# Patient Record
Sex: Male | Born: 1960 | Race: White | Hispanic: No | Marital: Single
Health system: Midwestern US, Community
[De-identification: ages and names within clinical notes are randomized; demographics above are authoritative.]

## PROBLEM LIST (undated history)

## (undated) DIAGNOSIS — I480 Paroxysmal atrial fibrillation: Secondary | ICD-10-CM

## (undated) DIAGNOSIS — I10 Essential (primary) hypertension: Secondary | ICD-10-CM

## (undated) DIAGNOSIS — R972 Elevated prostate specific antigen [PSA]: Secondary | ICD-10-CM

## (undated) DIAGNOSIS — Z1211 Encounter for screening for malignant neoplasm of colon: Secondary | ICD-10-CM

## (undated) HISTORY — PX: APPENDECTOMY: SHX54

---

## 1999-09-30 NOTE — ED Provider Notes (Signed)
[facility]                      EMERGENCY DEPARTMENT TREATMENT REPORT   NAME:  Roberto [name], Roberto [name]   MR #:  [id number]   BILLING #: [id number]        DOS: 09/30/1999  TIME: 5:11 P   cc:   Primary Physician:   Time of evaluation:  1700 hours.   CHIEF COMPLAINT:  Sore throat.   HISTORY OF PRESENT ILLNESS:  This 39 year old male seen on 30 September 1999   complains a sore throat since six o'clock this morning.  This is his only   complaint.  He rates the pain a 4 out of 10 except when he swallows.  Also   complains of a swollen uvula which has flared up since this morning.  He   denies any fever, chills, nausea, vomiting, or diarrhea.  No shortness of   breath, chest pain, or headache.  He denies any GU or GI problems.   PAST MEDICAL HISTORY:  Appendectomy in the past, hypertension.   SOCIAL HISTORY:  He is a smoker.   MEDICATIONS:  He denies any medication.   ALLERGIES:  No known allergies.   REVIEW OF SYSTEMS:   CONSTITUTIONAL:  Denies fever, chills, or weight loss.   CARDIOVASCULAR:  Denies chest pain, pressure, or palpitations.  The patient   denies shortness of breath, dizziness, diaphoresis, N/V, radiation, or   paresthesias.   RESPIRATORY:  Denies shortness of breath, hemoptysis, productive cough.   He denies any neck stiffness, denies any difficulty breathing.  There is no   stridor on listening to his throat.  No cva tenderness.  No numbness.   PHYSICAL EXAMINATION:   VITAL SIGNS:  Blood pressure mechanical was 163/99, it will be retaken;   pulse 77; respirations 16; temperature 97.3. Rates it 4 out of 10 on a pain   scale.   GENERAL:  Verbal, relaxed, cooperative.  Well-developed, well-nourished,   conscious, nontoxic, appears hydrated, alert, and oriented.  No respiratory   distress.  Appearance and behavior are age and situation appropriate.   LUNGS:  Clear to auscultation, no retraction/bulging of intercostal spaces.    No adventitious sounds noted.  Nontender to AP and lateral compression.   CARDIOVASCULAR:  Heart RRR, without obvious murmurs, gallops, rubs, or   thrills.  Carotid pulses 2+ and equal bilaterally without bruits.  DP   pulses 2+ and equal bilaterally.  No peripheral edema or significant   varicosities.   HEENT:  Unremarkable except for extremely erythematous posterior pharynx,   soft palate, and swollen, edematous uvula. There is also evidence of   extreme erythema to the anterior and the posterior walls of the pharynx.   There is no neck stiffness.  The neck was supple, nontender, no meningeal   signs.  At this time there was still no cervical or tonsillar adenopathy   noted.   No further testing was necessary. Repeat blood pressure was taken.  There   is no evidence of peritonsillar abscess.  There is no trismus.  There is no   facial swelling or cellulitis.  There is no mastoid tenderness.  Tympanic   membranes were unremarkable, intact, normal landmarks.  No sinus tenderness   on percussion.  His blood pressure retaken manually with a large cuff was   150/96.   FINAL DIAGNOSIS:   1.  Acute pharyngitis.   2.  Uvulitis.   3.  Hypertension  which needs evaluation.   DISPOSITION:  The patient was given verbal/written instructions in methods   of self-care, medication usage/compliance, and avoidance of aggravating   activities.  The patient should contact the referral physician and/or their   private MD.  The patient was advised not to return to full activities until   cleared by private MD or specialist, unless pain free and without any   symptoms.  The patient is discharged in stable condition with instructions   to return for any worsening or return of symptoms.    Information on acute   cellulitis and hypertension is provided.  The patient is to call [facility] for followup within 2-3 days and evaluation of elevated blood   pressure.  Return if fever, signs or symptoms worsen or persist.  The    patient was given Tylenol No. 3, Pen.Vee K, and Prednisone to be taken as   instructed.  Authorized not to work until April 6th.  This patient was   released in stable condition.   Electronically Signed By:   Wetzel Bjornstad Arvella Merles, M.D. 10/06/1999 03:11   ____________________________   Wetzel Bjornstad. Arvella Merles, M.D.   cd D:  09/30/1999 T:  10/04/1999 10:01 A   [id number]   Wolfgang Phoenix, PA-C

## 2000-08-25 NOTE — Procedures (Signed)
.*  Regular sinus rhythm   *RSR prime in right precordial leads   .*No prior tracing for comparison

## 2002-09-13 NOTE — ED Provider Notes (Signed)
[facility]                      EMERGENCY DEPARTMENT TREATMENT REPORT   NAME:  Roberto [name]                       PT. LOCATION:     ER  QC11   MR #:         BILLING #: [id number]          DOS: 09/13/2002   TIME:12:35 A   04-23-24   cc:   Primary Physician:   Time seen: 2340.   CHIEF COMPLAINT:   Bleeding from surgical site.   HISTORY OF PRESENT ILLNESS:   At 1500 today this 42 year old male underwent   removal of a left thoracic back sebaceous cyst at Dr. Theora Gianotti office.  He   was at home when later he felt some blood rushing down his back, so he came   to the emergency room for evaluation.  He denies any pain at this time.  No   fever or chills.   REVIEW OF SYSTEMS:   NEUROLOGICAL:   No sensory or motor symptoms.   PAST MEDICAL HISTORY:   Hypertension.   MEDICATIONS:   No current medications.   ALLERGIES:   No known drug allergies.   SOCIAL HISTORY:   Drinks several alcoholic beverages daily.   PHYSICAL EXAMINATION:   CONSTITUTIONAL:   Alert 42 year old male.   VITAL SIGNS:   On admission, blood pressure 167/111, pulse 62, respirations   18, temperature 96.5, O2 saturation is 100% on room air.   INTEGUMENTARY:  Left thoracic back: There is a 3-cm surgical incision over   the left thoracic back with 3 simple interrupted sutures intact.  There is   some oozing around the wound edges. No signs of infection.  No purulent   discharge.  The area is not tender to palpation.   IMPRESSION/MANAGEMENT PLAN:   The patient presents with what appeared to be   some fairly significant bleeding from his surgical site. I feel I will need   to insert 2 horizontal mattress sutures just to ensure that he does not   continue to bleed.  May also apply some Surgicel if there is still some   oozing.   PROCEDURES:  The wound was prepped in sterile Betadine fashion and   anesthetized with 2% lidocaine with epinephrine using 2 cc and washed with    normal saline.  Two horizontal mattress sutures were inserted around the   already existing sutures.  The bleeding did stop with the exception of one   small area which was trickling some blood. I then applied Surgicel and a   bulky dressing.  He tolerated well.   FINAL DIAGNOSIS:  Bleeding from surgical site, repaired.   DISPOSITION:   The patient was examined by Dr. Manson Passey who agrees with the   assessment and plan.  The patient was discharged to home with verbal and   written instructions for ongoing care. Instructed to keep the area clean   and dry in 2 to 3 days.  Soak off the Surgicel with a mixture of hydrogen   peroxide and water. Return for signs of infection.   Follow up with Dr.   Zachery Dauer.   Electronically Signed By:   Docia Furl, M.D. 09/15/2002 03:46   ____________________________   Docia Furl, M.D.   ec  D:  09/14/2002  T:  09/14/2002 12:39 P   [id number]   Dineen Kid, PA-C
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
## 2008-10-04 NOTE — ED Provider Notes (Signed)
[facility]                      EMERGENCY DEPARTMENT TREATMENT REPORT   NAME:  Roberto [name]               SEX:            M   DATE:  10/04/2008                     DOB:            09-Dec-1960   MR#    [id number]                       TIME SEEN        5:19 P   ACCT#  [id number]                      ROOM:           ER  ER10   cc:   Primary care physician   [facility] care.   CHIEF COMPLAINT   Back, rib pain.   HISTORY OF PRESENT ILLNESS   This is a 48 year old gentleman who fell approximately 1 week ago.  He   slipped on some steps and fell on his back injuring his right thoracic   region.  He was followed up at [facility] who at that time did not   feel that he needed x-rays and told him to go ahead and take Motrin that   was approximately 4 days ago .  The patient comes in today because he feels   as if he is having significant muscle spasms in his right thoracic region.   Feels like a needle-like pain is stabbing him.  He denies any pain on deep   inhalation.  He admits that his pain increases with movement.  He denies   any shortness of breath, abdominal pain, nausea or vomiting.  He denies any   other alleviating or aggravating factors associated with his condition.   REVIEW OF SYSTEMS   CONSTITUTIONAL:  No fever, chills, weight loss.   ENT: No sore throat, runny nose or other URI symptoms.   RESPIRATORY:  No cough, shortness of breath, or wheezing.   CARDIOVASCULAR:  No chest pain, chest pressure, or palpitations.   GASTROINTESTINAL:  No vomiting, diarrhea, or abdominal pain.   MUSCULOSKELETAL:  Right back thoracic pain.   INTEGUMENTARY:  No rashes.   PAST MEDICAL HISTORY   Hypertension.   CURRENT MEDICATIONS   Diovan, ibuprofen.   ALLERGIESNo known drug allergies.   SOCIAL HISTORY   Admits to tobacco use   PHYSICAL EXAMINATION   VITAL SIGNS:  Blood pressure is 179/109, pulse of 76, respirations 14,    temperature is 98.2, pain is 9/10, O2 saturation is 99% on room air.   GENERAL APPEARANCE:  The patient appears well developed and well nourished.   Appearance and behavior are age and situation appropriate.   HEENT:  Eyes:  Conjunctivae clear, lids normal.  Pupils equal, symmetrical,   and normally reactive.  Ears/Nose:  Hearing is grossly intact to voice.   Internal and external examinations of the ears and nose are unremarkable.   Mouth/Throat:  Surfaces of the pharynx, palate, and tongue are pink, moist,   and without lesions.   NECK:  Supple, nontender, symmetrical, no masses or JVD, trachea midline.   Thyroid not enlarged,  nodular, or tender.   LYMPHATIC:  No cervical or submandibular lymphadenopathy palpated.   RESPIRATORY:  Clear and equal breath sounds.  No respiratory distress,   tachypnea, or accessory muscle use.   CARDIOVASCULAR:  Heart regular, without murmurs, gallops, rubs, or thrills.   PMI not displaced.   CHEST:  Chest symmetrical without masses or tenderness.   GI:  Abdomen soft, nontender, without complaint of pain to palpation.  No   hepatomegaly or splenomegaly.   No abdominal or inguinal masses appreciated by inspection or palpation.   MUSCULOSKELETAL:  Stance and gait appear normal.   SPINE:  There is no localized cervical, thoracic, lumbar or sacral bony   tenderness to palpation or fist percussion.  There are no bony step-offs,   ecchymoses, areas of soft tissue swelling or deformities.   The patient does have pain to palpation over the right thoracic   paravertebral muscles.  He has excellent range of motion of his back.  ____   extend back, chest from side-to-side, however he does elicit pain.   SKIN:  Warm and dry without rashes.   INITIAL ASSESSMENT AND MANAGEMENT PLAN   This is a 48 year old gentleman that comes in with muscle spasms and pain   located in his right thoracic region.  Status post a fall 1 week ago.  We   are going to go ahead and get x-ray just to verify the absence or  presence   of pulmonary contusion, rib fracture.  Otherwise, we will go ahead and send   him home on some muscle relaxers and pain medication.   DIAGNOSTIC STUDIES   Chest 2 views normal study.   CLINICAL COURSE   During the patient's stay in the emergency room he did not develop any new   or worsening symptoms.  He remained stable.   CLINICAL IMPRESSION/DIAGNOSIS   Muscle spasms.   DISPOSITION AND PLAN   The patient was discharged home in stable condition with discharge   instructions on muscle spasm.  He is to follow up with primary care.   Return to the emergency room if condition worsens or new symptoms develop.   Prescription for Robaxin and Vicodin were given.   Electronically Signed By:   Stormy Card, M.[name]. 10/24/2008 22:22   ____________________________   Stormy Card, M.[name].   My signature above authenticates this document and my orders, the final   diagnosis(es), discharge prescription(s) and instructions in the [facility] record.   ML  [name]:  10/04/2008  T:  10/05/2008  1:00 P   [id number]

## 2011-10-20 NOTE — Procedures (Signed)
Test Reason : Chest pain   Blood Pressure : ***/*** mmHG   Vent. Rate : 115 BPM     Atrial Rate : 127 BPM      P-R Int : 152 ms          QRS Dur : 088 ms       QT Int : 338 ms       P-R-T Axes : 024 046 049 degrees      QTc Int : 467 ms   Probable atrial tachycardia   Otherwise normal ECG   When compared with ECG of 25-Aug-2000 17:23,   No significant change since prior tracing   Confirmed by Hyacinth Meeker, M.D., Jimmy ([id number]) on 10/20/2011 4:56:14 PM   Referred By:             Overread By: Henreitta Cea, M.D.

## 2011-10-20 NOTE — Procedures (Signed)
Study ID: [id number]                                                      [facility]                                                      [address] [place], [place] [address]                            Exercise Stress Echocardiogram Report           Name: NICKOLAI, RINKS Date: 10/21/2011 12:19 PM   MRN: [id number]                 Patient Location: ERO^EO10^EO10^C   DOB: 11/08/1960             Age: 51 yrs   Height: 70 in               Weight: 242 lb                         BSA: 2.3 m2   BP: 142/92 mmHg             HR: 127   Gender: Male                Account #: [id number]   Reason For Study: CHEST PAIN   History: smoker,cocaine,   Ordering Physician: Cipriano Mile, DAVID   Performed By: Nancie Neas., RDCS       Interpretation Summary   The Electrocardiographic Interpretation: negative by ECG criteria.   Exercise increased PAC's, VPC's and short bursts of MAT.   The Echocardiographic Interpretation: hyperkinetic wall motion.   The OverAll Impression : negative for ischemia with low likelihood for CAD.       Stress Results              Protocol:  Bruce              Target HR: 143 bpm         Maximum Predicted HR: 168 bpm                          Stress Duration:   3:14 mm:ss *                      Maximum Stress HR: 160 bpm *           Stress Comments   A treadmill exercise test according to Bruce protocol was performed. NSR with   PAC's and short bursts MAT,. The baseline ECG displays normal ST segments.   PAC,PVC, and increased bursts MAT with exercise. There no ST  segment changes   during stress. Test was terminated due to target heart rate was achieved.   Patient developed no symptoms. Patient had no chest pain. Patient was   hypertensive.           I      WMSI = 1.00     % Normal = 100                                                                   No LV  segment   al                                                                   wall motion                                                                   abnormalities.                                                           II      WMSI = 1.00     % Normal = 100                                                                   No LV segment   al                                                                   wall motion                                                                   abnormalities.  Segments  Size   X - Cannot   1 - Normal   2 -         3 - Akinetic 4 -          1-2     small   Interpret                 Hypokinetic              Dyskinetic   3-5     moder   ate   5 -                                                             6-14    large   Aneurysmal                                                      15-16   diffu   se               Left Ventricle   The left ventricular chamber size at rest is normal. The left ventricular   chamber size at peak stress is smaller.       _____________________________________________________________________________   __           Electronically signed byDr. Barbara Cower. Hyacinth Meeker, MD   10/21/2011 01:38 PM

## 2011-10-20 NOTE — Procedures (Signed)
Test Reason : Chest pain   Blood Pressure : ***/*** mmHG   Vent. Rate : 112 BPM     Atrial Rate : 150 BPM      P-R Int : 000 ms          QRS Dur : 088 ms       QT Int : 348 ms       P-R-T Axes : 018 011 036 degrees      QTc Int : 475 ms   Sinus tachycardia With premature atrial complexes   Otherwise normal ECG   When compared with ECG of 20-Oct-2011 09:34, No significant change was found   Confirmed by Cleon Gustin, M.D., Madelynn Done. ([id number]) on 10/21/2011 9:29:26 AM   Referred By:             Overread By: Merlene Pulling, M.D.

## 2011-10-20 NOTE — Procedures (Signed)
Study ID: [id number]                                                      [facility]                                                      [address]                                                       [place], [place] [address]                            Exercise Stress Echocardiogram Report           Name: [name], Roberto D      Study Date: 10/21/2011 12:19 PM   MRN: [id number]                 Patient Location: ERO^EO10^EO10^C   DOB: 06/29/1960             Age: 50 yrs   Height: 70 in               Weight: 242 lb                         BSA: 2.3 m2   BP: 142/92 mmHg             HR: 127   Gender: Male                Account #: [id number]   Reason For Study: CHEST PAIN   History: smoker,cocaine,   Ordering Physician: PITROLO, DAVID   Performed By: Chitwood, Kristen A., RDCS       Interpretation Summary   The Electrocardiographic Interpretation: negative by ECG criteria.   Exercise increased PAC's, VPC's and short bursts of MAT.   The Echocardiographic Interpretation: hyperkinetic wall motion.   The OverAll Impression : negative for ischemia with low likelihood for CAD.       Stress Results              Protocol:  Bruce              Target HR: 143 bpm         Maximum Predicted HR: 168 bpm                          Stress Duration:   3:14 mm:ss *                      Maximum Stress HR: 160 bpm *           Stress Comments   A treadmill exercise test according to Bruce protocol was performed. NSR with   PAC's and short bursts MAT,. The baseline ECG displays normal ST segments.   PAC,PVC, and increased bursts MAT with exercise. There no ST   segment changes   during stress. Test was terminated due to target heart rate was achieved.   Patient developed no symptoms. Patient had no chest pain. Patient was   hypertensive.           I      WMSI = 1.00     % Normal = 100                                                                   No LV  segment   al                                                                   wall motion                                                                   abnormalities.                                                           II      WMSI = 1.00     % Normal = 100                                                                   No LV segment   al                                                                   wall motion                                                                   abnormalities.                                                                                                                         Segments  Size   X - Cannot   1 - Normal   2 -         3 - Akinetic 4 -          1-2     small   Interpret                 Hypokinetic              Dyskinetic   3-5     moder   ate   5 -                                                             6-14    large   Aneurysmal                                                      15-16   diffu   se               Left Ventricle   The left ventricular chamber size at rest is normal. The left ventricular   chamber size at peak stress is smaller.       _____________________________________________________________________________   __           Electronically signed byDr. Edward C. Miller, MD   10/21/2011 01:38 PM

## 2011-10-20 NOTE — ED Provider Notes (Signed)
MEDICATION ADMINISTRATION SUMMARY              Drug Name: *Tylenol, Dose Ordered: 650 mg, Route: Oral, Status:         Ordered, Time: 12:36 10/20/2011,          Drug Name: *Zofran, Dose Ordered: 4 mg, Route: IV Push, Status:         Ordered, Time: 12:36 10/20/2011,          Drug Name: *Morphine Sulfate, Dose Ordered: 2-4 mg, Route: IV Push,         Status: Ordered, Time: 12:36 10/20/2011,          Drug Name: *Mylanta, Dose Ordered: 30 mL, Route: Oral, Status:         Ordered, Time: 12:36 10/20/2011,          Drug Name: *Metoprolol Tartrate, Dose Ordered: 5 mg mg, Route: IV         Push, Status: Held, Time: 03:51 10/21/2011,          Drug Name: *Catapres, Dose Ordered: 0.1 mg, Route: Oral, Status:         Given, Time: 02:40 10/21/2011,          Drug Name: *Diovan, Dose Ordered: 160 mg, Route: Oral, Status: Given,         Time: 23:37 10/20/2011,          Drug Name: *Catapres, Dose Ordered: 0.1 mg, Route: Oral, Status:         Given, Time: 22:19 10/20/2011,          Drug Name: *Ibuprofen, Dose Ordered: 600 mg, Route: Oral, Status:         Given, Time: 21:13 10/20/2011,          Drug Name: *Ambien, Dose Ordered: 5 mg, Route: Oral, Status: Given,         Time: 21:12 10/20/2011,          Drug Name: Aspirin, Dose Ordered: 325 mg, Route: Oral, Status: Given,         Time: 12:29 10/20/2011,          Drug Name: Ibuprofen, Dose Ordered: 800 mg, Route: Oral, Status:         Given, Time: 11:30 10/20/2011,          Drug Name: Captopril, Dose Ordered: 25 mg, Route: Oral, Status:         Given, Time: 11:15 10/20/2011,          Drug Name: Nitro-Bid, Dose Ordered: 1 in, Route: Topical, Status:         Given, Time: 11:15 10/20/2011, *Additional information available in         notes, Detailed record available in Medication Service section.       KNOWN ALLERGIES   NKDA       OBS PROGRESS NOTE   OBS PROGRESS NOTE: OBS Progress Notes. Patient with normal         enzymes X 3, For cardiac stress in AM. (19:39 [name])      OBS Progress Notes. No new issues. BP remains high. Pt is supposed to be         on Diovan. May also need PRN Clonidine. Enz neg x 3. Cont CPP. (22:09         [name])     OBS Progress Notes. Patient hypertensive. Given clonidine 0.1 PO - 2nd         dose. Patient without complaints. Tachycardia persists. CVAL to see  prior to stress test. Will page. [name]. (Thu Oct 21, 2011 02:42         [name])     OBS Progress Notes. Pt resting without complaint. BP finally down. CVAL         consult. Continue current plan. (Thu Oct 21, 2011 05:05 [name])     OBS Progress Notes. agree with Ms [facility] note. Morey Hummingbird Oct 21, 2011 07:05         [name])       TRIAGE [name] Oct 20, 2011 09:37 [name])   TRIAGE NOTES:  STRAIGHT BACK - NEEDS HT/WT/VITALS/ALLERGIES/MEDS,         PLEASE. (Wed Oct 20, 2011 09:37 [name])   PATIENT: NAME: Roberto [name], AGE: 51, GENDER: male, DOB: Fri         07/26/1960, TIME OF GREET: Wed Oct 20, 2011 09:34, LANGUAGE:         [place], [place]: [id number], MEDICAL RECORD NUMBER: [id number], ACCOUNT         NUMBER: [id number], PCP: [facility],. (Wed Oct 20, 2011 09:37         [name])   ADMISSION: URGENCY: 2, TRANSPORT: Wheelchair, DEPT: Emergency,         BED: 2ED 30. (Wed Oct 20, 2011 09:37 [name])   COMPLAINT:  Cp. (Wed Oct 20, 2011 09:37 [name])   PRESENTING COMPLAINT:  SOB, For 3, weeks. (09:43 [name])   PAIN: No complaint of pain. (09:43 [name])      LT foot pain (I think I broke my foot). (09:43 [name])   IMMUNIZATIONS:  Last tetanus shot received less than 10 years         ago. (09:43 [name])   TREATMENT PRIOR TO ARRIVAL: None. (09:43 [name])   TB SCREENING: TB screen negative for this patient. (09:43         [name])   ABUSE SCREENING: Patient denies physical abuse or threats. (09:43         [name])   FALL RISK: Patient has a low risk of falling. (09:43 [name])   SUICIDAL IDEATION: Suicidal ideation is not present. (09:43         [name])   ADVANCE DIRECTIVES: Patient does not have advance directives.         (09:43 [name])    PROVIDERS: TRIAGE NURSE: Theodis Sato, RN. (Wed Oct 20, 2011         09:37 [name])   COMMENT:  EKG done at triage. (Wed Oct 20, 2011 09:37 [name])   PREVIOUS VISIT ALLERGIES: Nkda. (Wed Oct 20, 2011 09:37         [name])       PRESENTING PROBLEM (09:37 [name])      Presenting problems: Chest Pain - Adult.       CURRENT MEDICATIONS   Ibuprofen:  600 mg Oral As needed every 8 hours. prn pain. (21:02         [name])   Robaxin-750:  1 tab Oral As needed every four hours. x 750 mg -         Oral - Q4PRN. (21:02 [name])   Vicodin:  1 tab(s) Oral As needed every four hours. x 500 Mg-5 Mg         - Oral - Q4PRN. (21:02 [name])   Diovan:  120 mg Oral 2 times a day. (21:40 [name])       MEDICATION SERVICE   Ambien:  Order: Ambien (Zolpidem Tartrate) - Dose: 5 mg         :  Oral         Notes: At night PRN insomnia         Ordered by: Sharma Covert, PA-C         Entered by: Sharma Covert, PA-C Wed Oct 20, 2011 12:36 ,          Acknowledged by: Dara Hoyer Wed Oct 20, 2011 12:46         Documented as given by: Reggie Pile, RN Wed Oct 20, 2011 21:12          Patient, Medication, Dose, Route and Time verified prior to         administration.          Site: Medication administered P.O., Correct patient, time, route,         dose and medication confirmed prior to administration, Patient         advised of actions and side-effects prior to administration,         Allergies confirmed and medications reviewed prior to administration,         Patient tolerated procedure well, Patient in position of comfort,         Side rails up, Cart in lowest position, Family at bedside, Call light         in reach, for not able go to sleep.   Aspirin:  Order: Aspirin - Dose: 325 mg : Oral         POTENTIAL SEVERE INTERACTION: Ibuprofen - Low risk interaction         Ordered by: Sharma Covert, PA-C         Entered by: Sharma Covert, PA-C Wed Oct 20, 2011 12:22          Documented as given by: Leane Platt, RN Wed Oct 20, 2011 12:29           Patient, Medication, Dose, Route and Time verified prior to         administration.          Site: Medication administered P.O., Correct patient, time, route,         dose and medication confirmed prior to administration, Patient         advised of actions and side-effects prior to administration,         Allergies confirmed and medications reviewed prior to administration.   Captopril:  Order: Captopril - Dose: 25 mg : Oral         Ordered by: Sharma Covert, PA-C         Entered by: Sharma Covert, PA-C Wed Oct 20, 2011 11:02 ,          Acknowledged by: Rodena Piety, RN Wed Oct 20, 2011 11:14         Documented as given by: Rodena Piety, RN Wed Oct 20, 2011 11:15          Patient, Medication, Dose, Route and Time verified prior to         administration.          Amount given: 25 mg, Site: Medication administered P.O., Correct         patient, time, route, dose and medication confirmed prior to         administration, Patient advised of actions and side-effects prior to         administration, Allergies confirmed and medications reviewed prior to         administration.   Catapres:  Order: Catapres (Clonidine Hydrochloride) -  Dose: 0.1 mg : Oral         Notes: For B/P systolic &gt;/= 180 or B/P diastolic &gt;/= 100 give 0.1mg  x         1 dose         Ordered by: Stephan Minister, MD         Entered by: Stephan Minister, MD Wed Oct 20, 2011 22:11 ,          Acknowledged by: Reggie Pile, RN Wed Oct 20, 2011 22:12         Documented as given by: Reggie Pile, RN Wed Oct 20, 2011 22:19          Patient, Medication, Dose, Route and Time verified prior to         administration.          Amount given: 0.1 mg, Site: Medication administered P.O., Correct         patient, time, route, dose and medication confirmed prior to         administration, Patient advised of actions and side-effects prior to         administration, Allergies confirmed and medications reviewed prior to          administration, Patient tolerated procedure well, Patient in position         of comfort, Side rails up, Cart in lowest position, Family at         bedside, Call light in reach.   Catapres:  Order: Catapres (Clonidine Hydrochloride) -         Dose: 0.1 mg : Oral         Notes: For B/P systolic &gt;/= 180 or B/P diastolic &gt;/= 100 give 0.1mg  x         1 dose         Ordered by: April Manson, PA-C         Entered by: April Manson, PA-C Thu Oct 21, 2011 02:19 ,          Acknowledged by: Reggie Pile, RN Thu Oct 21, 2011 02:20         Documented as given by: Reggie Pile, RN Thu Oct 21, 2011 02:40          Patient, Medication, Dose, Route and Time verified prior to         administration.          Amount given: 0.1 mg, Site: Medication administered P.O., Correct         patient, time, route, dose and medication confirmed prior to         administration, Patient advised of actions and side-effects prior to         administration, Allergies confirmed and medications reviewed prior to         administration, Patient tolerated procedure well, Patient in position         of comfort, Side rails up, Cart in lowest position, Family at         bedside, Call light in reach.   Diovan:  Order: Diovan (Valsartan) - Dose: 160 mg :         Oral         Notes: 2 times daily, Per order may take per Med Recon         Ordered by: Sharma Covert, PA-C         Entered by: Reggie Pile, RN Wed Oct 20, 2011 21:38 ,  Acknowledged by: Reggie Pile, RN Wed Oct 20, 2011 21:38         Documented as given by: Reggie Pile, RN Wed Oct 20, 2011 23:37          Patient, Medication, Dose, Route and Time verified prior to         administration.          Amount given: 160 mg, Site: Medication administered P.O., Correct         patient, time, route, dose and medication confirmed prior to         administration, Patient advised of actions and side-effects prior to          administration, Allergies confirmed and medications reviewed prior to         administration, Patient tolerated procedure well, Patient in position         of comfort, Side rails up, Cart in lowest position, Family at         bedside, Call light in reach.   Ibuprofen:  Order: Ibuprofen - Dose: 800 mg : Oral         Ordered by: Sharma Covert, PA-C         Entered by: Sharma Covert, PA-C Wed Oct 20, 2011 11:23 ,          Acknowledged by: Rodena Piety, RN Wed Oct 20, 2011 11:31         Documented as given by: Rodena Piety, RN Wed Oct 20, 2011 11:30          Patient, Medication, Dose, Route and Time verified prior to         administration.          Amount given: 800 mg, Site: Medication administered P.O., Correct         patient, time, route, dose and medication confirmed prior to         administration, Patient advised of actions and side-effects prior to         administration, Allergies confirmed and medications reviewed prior to         administration.   Ibuprofen:  Order: Ibuprofen - Dose: 600 mg : Oral         Notes: every 8hrs prn, Per order may take per Med Recon         Ordered by: Sharma Covert, PA-C         Entered by: Reggie Pile, RN Wed Oct 20, 2011 21:04          Documented as given by: Reggie Pile, RN Wed Oct 20, 2011 21:13          Patient, Medication, Dose, Route and Time verified prior to         administration.          Amount given: 600 mg, Site: Medication administered P.O., Correct         patient, time, route, dose and medication confirmed prior to         administration, Patient advised of actions and side-effects prior to         administration, Allergies confirmed and medications reviewed prior to         administration, Patient tolerated procedure well, Patient in position         of comfort, Side rails up, Cart in lowest position, Family at         bedside, Call light in reach.   Metoprolol Tartrate:  Order: Metoprolol Tartrate -  Dose: 5 mg mg : IV Push          Notes: q X 3         Ordered by: Carolin Guernsey, DO         Entered by: Carolin Guernsey, DO Thu Oct 21, 2011 03:32 ,          Acknowledged by: Reggie Pile, RN Thu Oct 21, 2011 03:44,          Held by: Reggie Pile, RN Thu Oct 21, 2011 03:51 Reason: Symptoms         controlled at present.   Morphine Sulfate:  Order: Morphine Sulfate - Dose: 2-4         mg : IV Push         Notes: Every 4 hours PRN pain         Ordered by: Sharma Covert, PA-C         Entered by: Sharma Covert, PA-C Wed Oct 20, 2011 12:36 ,          Acknowledged by: Dara Hoyer Wed Oct 20, 2011 12:46.   Mylanta:  Order: Mylanta (Aluminum Hydroxide/Magnesium         Hydroxide/Simethicone) - Dose: 30 mL : Oral         Notes: Every 4 hours PRN for heartburn         Ordered by: Sharma Covert, PA-C         Entered by: Sharma Covert, PA-C Wed Oct 20, 2011 12:36 ,          Acknowledged by: Dara Hoyer Wed Oct 20, 2011 12:46.   Nitro-Bid:  Order: Nitro-Bid (Nitroglycerin) - Dose: 1         in : Topical         Ordered by: Sharma Covert, PA-C         Entered by: Sharma Covert, PA-C Wed Oct 20, 2011 11:02 ,          Acknowledged by: Rodena Piety, RN Wed Oct 20, 2011 11:14         Documented as given by: Rodena Piety, RN Wed Oct 20, 2011 11:15          Patient, Medication, Dose, Route and Time verified prior to         administration.          Time given: 1115, Amount given: 1 inch, Correct patient, time,         route, dose and medication confirmed prior to administration, Patient         advised of actions and side-effects prior to administration,         Allergies confirmed and medications reviewed prior to administration.   Tylenol:  Order: Tylenol (Acetaminophen) - Dose: 650 mg         : Oral         Notes: Every 4 hours PRN for headache         Ordered by: Sharma Covert, PA-C         Entered by: Sharma Covert, PA-C Wed Oct 20, 2011 12:36 ,          Acknowledged by: Dara Hoyer Wed Oct 20, 2011 12:46.    Zofran:  Order: Zofran (Ondansetron Hydrochloride) -         Dose: 4 mg : IV Push         Notes: Every 4 hours PRN nausea or vomiting  Ordered by: Sharma Covert, PA-C         Entered by: Sharma Covert, PA-C Wed Oct 20, 2011 12:36 ,          Acknowledged by: Dara Hoyer Wed Oct 20, 2011 12:46.       ORDERS   12 LEAD EKG:  Ordered for: Cipriano Mile, MD, Onalee Hua         Status: Active. (09:37 [name])   BP Monitor:  Ordered for: Cipriano Mile, MD, Onalee Hua         Status: Done by Wilmer Floor, RN, Marcille Buffy Oct 20, 2011 10:11. (09:51         KMJ)   IV- Saline Lock:  Ordered for: Cipriano Mile, MD, Onalee Hua         Status: Done by Wilmer Floor RN, Marcille Buffy Oct 20, 2011 10:11. (09:51         KMJ)   BASIC METABOLIC PANEL:  Ordered for: Cipriano Mile, MD, David         Status: Done by System Wed Oct 20, 2011 10:38. (09:51 KMJ)   TROPONIN I:  Ordered for: Cipriano Mile, MD, David         Status: Done by System Wed Oct 20, 2011 10:48. (09:51 KMJ)   HAND HELD NEBULIZER:  Ordered for: Cipriano Mile, MD, Onalee Hua         Status: Active. (09:51 KMJ)   Urine dip (send for lab U/A if positive):  Ordered for: Cipriano Mile,         MD, Onalee Hua         Status: Done by Wilmer Floor, RN, Marcille Buffy Oct 20, 2011 10:11. (09:51         [facility])   Cardiac Monitor:  Ordered for: Cipriano Mile, MD, Onalee Hua         Status: Done by Wilmer Floor, RN, Marcille Buffy Oct 20, 2011 10:11. (09:51         KMJ)   CHEST 2 VIEWS:  Ordered for: Cipriano Mile, MD, Onalee Hua         Status: Active. (09:51 KMJ)   CBC, AUTOMATED DIFFERENTIAL:  Ordered for: Cipriano Mile, MD, David         Status: Done by System Wed Oct 20, 2011 10:50. (09:51 KMJ)   O2 sat Monitor:  Ordered for: Cipriano Mile, MD, Onalee Hua         Status: Done by Wilmer Floor RN, Marcille Buffy Oct 20, 2011 10:11. (09:51         KMJ)   T4 FREE:  Ordered for: Cipriano Mile, MD, David         Status: Done by System Wed Oct 20, 2011 10:48. (09:52 KMJ)   THYROID STIMULATING HORMONE:  Ordered for: Cipriano Mile, MD, David         Status: Done by System Wed Oct 20, 2011 10:48. (09:52 KMJ)    Dr. Cleon Gustin please:  Ordered for: Cipriano Mile, MD, Onalee Hua         Status: Done by Rutherford Guys Oct 20, 2011 10:45.         (10:41 [name])   Avelino Leeds Disposable:  Ordered for: Cipriano Mile, MD, Onalee Hua         Status: Active. (10:48 [name])   Elita Boone IV Cath:  Ordered for: Pitrolo, MD, Onalee Hua         Status: Active. (10:48 [name])   BP Cuff Adult Regular:  Ordered for: Pitrolo, MD, Onalee Hua         Status: Active. (10:48 [name])   MONITOR ELECTRODE:  Ordered for: Cipriano Mile, MD, Onalee Hua  Status: Active. (10:48 [name])   IV Start kit:  Ordered for: [employer], MD, Onalee Hua         Status: Active. (10:48 [name])   CONTINUOUS PULSE OX:  Ordered for: Cipriano Mile, MD, Onalee Hua         Status: Active. (10:48 [name])   CPK PROFILE:  Ordered for: Cipriano Mile, MD, Onalee Hua         Status: Done by System Wed Oct 20, 2011 12:15. (11:02 KMJ)   MYOGLOBIN (BLOOD):  Ordered for: Cipriano Mile, MD, Onalee Hua         Status: Done by System Wed Oct 20, 2011 12:15. (11:02 KMJ)   D-DIMER:  Ordered forCipriano Mile, MD, David         Status: Done by System Wed Oct 20, 2011 12:30. (11:42 [name])   ED OBSERVATION for CEP:  Ordered for: Cipriano Mile, MD, Onalee Hua         Status: Done by Rutherford Guys Oct 20, 2011 11:44.         (11:43 [name])   URINE DRUG SCREEN:  Ordered for: Cipriano Mile, MD, David         Status: Done by System Wed Oct 20, 2011 12:35. (11:57 KMJ)   URINAL:  Ordered for: Cipriano Mile, MD, David         Status: Active. (12:33 AC)   Cardiac Diet, NPO after 0200 for Stress Test in AM:  Ordered for:         Cipriano Mile, MD, Onalee Hua         Status: Done by Ed Blalock Oct 20, 2011 12:45. (12:35         KMJ)   Bed rest with BRP:  Ordered for: Cipriano Mile, MD, Onalee Hua         Status: Done by Ed Blalock Oct 20, 2011 12:45. (12:35         [facility])   Order Stress Test. Type ___exercise_____:  Lenna Gilford forCipriano Mile,         MD, Onalee Hua         Status: Done by Ed Blalock Oct 20, 2011 12:45. (12:35         KMJ)   Rapid Chest Pain Rule Out Protocol:  Ordered for: Cipriano Mile, MD,          Onalee Hua         Status: Done by Ed Blalock Oct 20, 2011 12:45. (12:35         KMJ)   Pt to receive CP packet and view video:  Ordered for: Cipriano Mile,         MD, Onalee Hua         Status: Done by Ed Blalock Oct 20, 2011 13:23. (12:35         [facility])   Draw repeat cardiac enzymes at: 3,6  hrs:  Ordered for: Cipriano Mile,         MD, Onalee Hua         Status: Done by Ed Blalock Oct 20, 2011 12:45. (12:35         [facility])   Draw repeat myoglobin level at:  3 hrs:  Ordered for: Cipriano Mile,         MD, Onalee Hua         Status: Done by Ed Blalock Oct 20, 2011 12:45. (12:35         KMJ)   enter temp:  Ordered for: Cipriano Mile, MD, Onalee Hua         Status: Done by Cyndie Chime,  Simeon Craft Oct 20, 2011 13:23. (12:47         [facility])   12 LEAD EKG:  Ordered for: Cipriano Mile, MD, Onalee Hua         Status: Active. (13:12 RLA0)   MYOGLOBIN (BLOOD):  Ordered for: Cipriano Mile, MD, Onalee Hua         Status: Done by System Wed Oct 20, 2011 14:05. (13:17 RLA0)   TROPONIN I:  Ordered for: Cipriano Mile, MD, David         Status: Done by System Wed Oct 20, 2011 14:05. (13:17 RLA0)   CPK PROFILE:  Ordered for: Cipriano Mile, MD, Onalee Hua         Status: Done by System Wed Oct 20, 2011 14:05. (13:17 RLA0)      Ordered for: Cipriano Mile, MD, David         Status: Done by System Wed Oct 20, 2011 17:57. (16:30 RLA0)   TROPONIN I:  Ordered for: Cipriano Mile, MD, David         Status: Done by System Wed Oct 20, 2011 17:57. (16:30 RLA0)   12 LEAD EKG:  Ordered for: Cipriano Mile, MD, Onalee Hua         Status: Active. (17:20 RLA0)   CTA Chest:  Ordered for: Himmel Salch, DO, Jennifer         Status: Active         Comment: Suspect pulmonary embolism. Morey Hummingbird Oct 21, 2011 03:31         [name])   Discharge patient to home:  Ordered for: Carmela Hurt, MD, Romeo Apple         Status: Done by Wynona Neat Oct 21, 2011 16:34. Morey Hummingbird Oct 21, 2011 16:13 WCW)       NURSING ASSESSMENT: RESPIRATORY /CHEST (09:40 [name])   NURSING DIAGNOSIS: Notes: SOB with nausea.    CONSTITUTIONAL: Complex assessment performed, History obtained         from patient, Patient arrives ambulatory, Gait steady, Patient         appears comfortable, Patient cooperative, Patient alert, Oriented to         person, place and time, Skin warm, Skin dry, Skin normal in color,         Mucous membranes pink, Mucous membranes moist, Patient is         well-groomed.   PAIN:  Denies chest pain.   RESPIRATORY/CHEST: Respiratory assessment findings include         respiratory effort easy, Respirations regular, Conversing normally,         no signs of distress, Breath sounds with rhonchi, Scattered         posterior., Breath sounds with wheezing, scattered, Inspiratory.,         Neck and chest exam findings include trachea midline, Chest expansion         equal, Chest movement symmetrical, no jugular vein distension.       OBSERVATION ASSESSMENT: CARDIOVASCULAR   CARDIOVASCULAR: Cardiovascular assessment findings include heart         rate, irregular, tachycardic, Heart rhythm, with Infrequent premature         ventricular contractions, Associated with dyspnea, with exertion,         Associated with palpitations described as. (19:10 [name])     Left radial pulse +4(strong and bounding), Right radial pulse +4(strong         and bounding), Left dorsalis pedis pulse +4(strong and bounding),  Right dorsalis pedis pulse +4(strong and bounding), Notes: Assumed         care of pt. Pt denies chest pain at this time, no SOB at this time,         Remains on tele, irregular heart rate, 80 to 150. NPO for stress         test. Pt did not receive nicotine patch due to elevated BP. (Thu Oct 21, 2011 08:00 MNS1)   RESPIRATORY: Respiratory assessment findings include respiratory         effort easy, Respirations regular, Conversing normally, Breath sounds         clear, to bilateral upper lobes, Breath sounds diminished, to         bilateral lower lobes, Neck and chest exam findings include trachea          midline, Chest expansion equal, Chest movement symmetrical. (19:10         [name])     Respiratory assessment findings include respiratory effort easy,         Respirations regular, Conversing normally, Breath sounds diminished,         to bilateral lower lobes. (Thu Oct 21, 2011 08:00 MNS1)   SAFETY: Side rails up, Cart/Stretcher in lowest position, Family         at bedside, Call light within reach, Hospital ID band on. (19:10         [name])     Side rails up, Cart/Stretcher in lowest position, Call light within         reach, Hospital ID band on. (Thu Oct 21, 2011 08:00 MNS1)       NURSING PROCEDURE: ADMISSION (12:22 AC)   ADMISSION: Report called to, mary rn obs, Provided opportunity to         answer questions, Admission orders received and completed, Report         called at 1220, Patient Admited at. 1235, Acuity level non-urgent,         Transported via wheelchair, Accompanied by emergency department         technician, Transported with disposable blood pressure cuff,         Transported with basic life support care, Transported with saline         lock, Summary of Care printed.       NURSING PROCEDURE: CARDIAC MONITOR (09:40 [name])   PATIENT IDENTIFIER: Patient's identity verified by patient         stating name, Patient's identity verified by patient stating birth         date, Patient's identity verified by hospital ID bracelet.   CARDIAC MONITOR: Cardiac monitoring indicated for compliant of an         irregular heart rate, Patient placed on cardiac monitor, Heart rate:         100-120, showing sinus tachycardia, without ectopy, with no ST         segment changes, Patient placed on non-invasive blood pressure         monitor, with disposable blood pressure cuff applied, Patient placed         on continuous pulse oximetry, Adult/pediatric oxisensor applied,         Notes: Reports nausea and SOB.   FOLLOW-UP: After procedure, alarms set and on, After procedure,          patient tolerating monitoring, Notes: NSR/ Sinus tachycardia.       NURSING PROCEDURE: DISCHARGE  NOTE (Thu Oct 21, 2011 16:34 MNS1)   DISCHARGE: Patient discharged to home, ambulating without         assistance, driving self, unaccompanied, Summary of Care printed/         provided, Prescriptions given and instructions on side effects given,         Medication reconciliation form given, Above person(s) verbalized         understanding of discharge instructions and follow-up care, Notes:         Saline lock dced.   BELONGINGS: Belongings and valuables with patient at time of         discharge include:, Belongings remain with patient.       NURSING PROCEDURE: EKG CHART   PATIENT IDENTIFIER: Patient's identity verified by patient         stating name, Patient's identity verified by patient stating birth         date, Patient's identity verified by hospital ID bracelet. (09:40         ARK1)   EKG: EKG indicated for complaint of chest pain, 12 lead EKG         performed on the left chest, done by [email or web address], first EKG. (09:49         ARK1)   FOLLOW-UP: After procedure, EKG for interpretation given to Dr.         Jimmey Ralph, EKG was given to Dr. at 7829, Notes: ekg done in traige         @0934 ,pt taken straight back to room 30.nancy r.n.notified. (09:40         ARK1)   NOTES: Patient tolerated procedure well. (09:40 ARK1)   SAFETY: Side rails up, Cart/Stretcher in lowest position, Call         light within reach, Hospital ID band on. (09:40 ARK1)       NURSING PROCEDURE: IV   PATIENT IDENITIFIER: Patient's identity verified by patient         stating name, Patient's identity verified by patient stating birth         date, Patient's identity verified by hospital ID bracelet. (10:10         [name])     Patient's identity verified by patient stating name, Patient's identity         verified by patient stating birth date, Patient's identity verified         by hospital ID bracelet. Morey Hummingbird Oct 21, 2011 04:15 [name])    IV SITE 1: IV therapy indicated for medication administration, IV         therapy indicated for Possible medication administration., IV         established, to the left hand, using a 20 gauge catheter, in one         attempt, IV site prepped with Chlorhexadine., Saline lock         established, Flushed with normal saline (mls): 10 ml, Labs drawn at         time of placement, labeled in the presence of the patient and sent to         lab, Tourniquet removed from patient after procedure., Labs labeled         in the presence of the patient and then sent to the Lab. (10:10         [name])   FOLLOW-UP SITE 1: After procedure, sterile transparent dressing         applied, After procedure, no drainage at IV site,  After procedure, no         swelling at IV site, After procedure, no redness at IV site. (10:10         [name])   IV SITE 2: IV therapy indicated for medication administration, IV         established, to the left antecubital, using a 20 gauge catheter, in         one attempt, Saline lock established, Tourniquet removed from patient         after procedure. Morey Hummingbird Oct 21, 2011 04:15 [name])   FOLLOW-UP SITE 2: After procedure, IV secured by, After         procedure, IV line connections checked and properly labeled, After         removal, sterile dressing applied to IV site. Morey Hummingbird Oct 21, 2011 04:15         [name])   SAFETY: Side rails up, Cart/Stretcher in lowest position, Family         at bedside, Call light within reach, Hospital ID band on. (Thu Oct 21, 2011 04:15 [name])       NURSING PROCEDURE: NURSE NOTES   NURSES NOTES: Notes: Heart rate remains 90-140 and BP elevated-         Please advise!. (10:50 [name])     Patient re-evaluated by physician, Patient re-positioned to semi-Fowler's         position, Notes: Heart rate remains 90-145- Re-evaluated by Khaniya Tenaglia-         Medicated for foot pain. (11:30 [name])       NURSING PROCEDURE: TEACHING (14:25 MNS1)    TEACHING: Notes: Pt viewed video for stress test. Pt received         written information fpr chest pain, health risk of smoking and         cessation of smoking.       NURSING PROCEDURE: TRANSPORT TO TESTS   PATIENT IDENTIFIER: Patient's identity verified by patient         stating name, Patient's identity verified by patient stating birth         date, Patient's identity verified by hospital ID bracelet. (09:50         [name])     Patient's identity verified by patient stating name, Patient's identity         verified by patient stating birth date, Patient's identity verified         by hospital ID bracelet. (10:02 JNO)     Patient's identity verified by patient stating name, Patient's identity         verified by patient stating birth date, Patient's identity verified         by hospital ID bracelet. Morey Hummingbird Oct 21, 2011 05:10 [name])     Patient's identity verified by patient stating name, Patient's identity         verified by patient stating birth date, Patient's identity verified         by hospital ID bracelet, Patient actively involved in identification         process. (Thu Oct 21, 2011 12:00 MNS1)   TRANSPORT TO TESTS: Transport indicated to facilitate diagnosis,         Patient transported to x-ray, via cart, Accompanied by x-ray         technician. (09:50 [name])     Patient transported to x-ray, via cart, Accompanied by x-ray technician,  Hand-off report received from [place]. (10:02 JNO)     Patient transported to CT scan, Accompanied by x-ray technician. Morey Hummingbird Oct 21, 2011 05:10 [name])     Patient transported to, Cardiology, ambulatory, Accompanied by nurse.         (Thu Oct 21, 2011 12:00 MNS1)   FOLLOW-UP: After procedure, patient returned to emergency         department. (10:02 [name])     After procedure, patient returned to emergency department, Notes:         Returned to OBS, on monitor. No chest pain at this time. Received         juice and crackers post test. Morey Hummingbird Oct 21, 2011 12:50 MNS1)    NOTES: Patient tolerated procedure well. Morey Hummingbird Oct 21, 2011 05:10         [name])   SAFETY: Side rails up, Cart/Stretcher in lowest position, Family         at bedside, Call light within reach, Hospital ID band on. (10:02         JNO)       NURSING PROCEDURE: URINE COLLECTION (12:00 [name])   PATIENT IDENTIFIER: Patient's identity verified by patient         stating name, Patient's identity verified by patient stating birth         date, Patient's identity verified by hospital ID bracelet.   URINE COLLECTION MALE: Urine collection indicated for UDS         obtained and sent.       OBSERVATION PROCEDURE: CARDIAC MONITOR (19:10 [name])   PATIENT IDENTIFIER: Patient's identity verified by patient         stating name, Patient's identity verified by patient stating birth         date, Patient's identity verified by hospital ID bracelet.   CARDIAC MONITOR: Cardiac monitoring indicated for complaint of         chest pain, Cardiac monitoring indicated for compliant of an         irregular heart rate, Cardiac monitoring indicated for complaint of         palpitations, Patient placed on cardiac monitor, showing sinus         tachycardia.       OBSERVATION PROCEDURE: EKG   PATIENT IDENTITY: Patient's identity verified by patient stating         name, Patient's identity verified by patient stating birth date,         Patient's identity verified by hospital ID bracelet, Patient actively         involved in identification process. (13:08 RLA0)     Patient's identity verified by patient stating name, Patient's identity         verified by patient stating birth date, Patient's identity verified         by hospital ID bracelet, Patient actively involved in identification         process. (17:17 RLA0)   EKG: EKG indicated for tachy, 12 lead EKG performed on the left         chest, done by rachael. (13:08 RLA0)     12 lead EKG performed on the left chest, done by Rachael. (17:17         RLA0)    FOLLOW-UP: After procedure, EKG for interpretation given to Dr.         Cipriano Mile, EKG was given to Dr. at 1310. (13:08 RLA0)  After procedure, EKG for interpretation given to Dr. Ashley Royalty, EKG was         given to Dr. at 1721. (17:17 RLA0)       OBSERVATION PROCEDURE: LAB DRAW   PATIENT IDENTIFIER: Patient's identity verified by patient         stating name, Patient's identity verified by patient stating birth         date, Patient's identity verified by hospital ID bracelet. (13:26         RLA0)     Patient's identity verified by patient stating name, Patient's identity         verified by patient stating birth date, Patient's identity verified         by hospital ID bracelet. (16:46 RLA0)   LAB DRAW: Subsequent lab draw performed, from vascular access         device, existing IV site, After labs drawn, device flushed with         saline, amount (mL) 10, Lab specimens labeled in the presence of the         patient and sent to lab. (13:26 RLA0)     Subsequent lab draw performed, from vascular access device, existing IV         site, After labs drawn, device flushed with saline, amount (mL) 10,         Lab specimens labeled in the presence of the patient and sent to lab.         (16:46 RLA0)       OBSERVATION PROCEDURE: NURSE NOTES   NURSE NOTES: Meal tray given to patient, Notes: Pt denies chest         pain at this time. states that he has pain in Lt foot. Remain on         tele, Sinus tach. EKG completed at 1315. (14:22 MNS1)     Notes: Pt remains on tele, in SR with HR at/ 77. Pt appears to be         sleeping, eyes closed. (15:42 MNS1)     Notes: Pt remains on tele. HR 39. Notified PA Salch. (17:05 MNS1)     Patient in no apparent distress, Notes: EKG completed, read by Dr.         Ashley Royalty. (17:20 MNS1)     Patient in no apparent distress, Meal tray given to patient, Notes: Pt         resting, denies chest pain. Remains on tele, Sinsus tach. (18:30         MNS1)      Patient in no apparent distress, Assistance offered to patient, Patient         re-evaluated by physician, Notes: Pitrolo, MD, Onalee Hua and had aware of         pt irregular hr with hr 100-140's. (19:39 [name])     Patient in no apparent distress, Patient resting quietly, Assistance         offered to patient, Notes: c/o left foot pain, motrin 600mg  given per         med recon order, tolerated well. No c/o chest discomfort or chest         palpitation at this time. SR with irregular hr and sinus tach noted         on cardiac monitor. Nursing plan of care and teaching discussed with         pt. Pt has no question at this time. (21:14 [name])  Patient in no apparent distress, Assistance offered to patient, Beverage         given to patient, Patient re-evaluated by physician, Notes: Jerolyn Center,         MD, [facility] and had aware pt elevated bp, new order received. (22:09         [name])     Patient in no apparent distress, Patient resting quietly, Assistance         offered to patient, Beverage given to patient, Notes: no c/o chest         discomfort at this time. Sinus tach with irregular hr, HR 100's on         monitor. (23:33 [name])     Patient in no apparent distress, Assistance offered to patient, Notes:         self amb to bathroom with steady gait, no c/o chest discomfort at         this time. Sinus tach with irregular hr noted on cardiac monitor, HR         110's. (Thu Oct 21, 2011 02:01 [name])     Patient re-evaluated by physician, Notes: DR. Wenda Overland and had aware         pt had elevated bp and HR 110's. New order receive: CVAL to see prior         to stress test. (Thu Oct 21, 2011 02:42 [name])     Notes: reviewed tele monitor, around the 1700th hour 10/20/11, there is no         bradicardic episode noted. Base line HR above 80-100's. (Thu Oct 21, 2011 03:16 [name])     Notes: BP 132/85, Held Metoprolol Tartrate per Dr Wenda Overland, ROVB.         (Thu Oct 21, 2011 03:50 [name])      Notes: Consult completed by [facility] PA. Order for stress test treadmill.         Morey Hummingbird Oct 21, 2011 09:19 MNS1)     Notes: Pt resting, no chest pain. Remains on tele with irregular heart         rate. Stress test pending. Morey Hummingbird Oct 21, 2011 10:09 MNS1)     Patient is awaiting results, Notes: Pt resting. No chest pain. Remains on         tele. Morey Hummingbird Oct 21, 2011 13:15 MNS1)     Patient in no apparent distress, Patient is awaiting disposition, Notes:         Pt resting, denies chest pain. Morey Hummingbird Oct 21, 2011 14:45 MNS1)       OBSERVATION PROCEDURE: TRANSPORT TO TEST (Thu Oct 21, 2011 13:01 RLA0)   PATIENT IDENTIFIER: Patient's identity verified by patient         stating name, Patient's identity verified by patient stating birth         date, Patient's identity verified by hospital ID bracelet, Patient         actively involved in identification process.   FOLLOW-UP: After procedure, patient returned to ED Observation         department, Notes: pt placed back on monitor, snack given, vs taken.   SAFETY: Cart/Stretcher in lowest position, Call light within         reach, Hospital ID band on.   VITAL SIGNS: BP: 139, / 105, BP: (Sitting), Pulse: 53, Resp: 18,         Temp: 97.5, (Oral), Pain: 0, O2 sat: 98,  on Room air.       DIAGNOSIS (11:43 [name])   FINAL: PRIMARY: chest pain with exertional dyspnea.       DISPOSITION   PATIENT:  Disposition Type: Observation, Disposition: Chest Pain         Observation, Condition: Evaluation in Progress. (11:43 [name])      Patient left the department. (Thu Oct 21, 2011 16:35 MNS1)       VITAL SIGNS   VITAL SIGNS: BP: 162/123 (Sitting), Pulse: 107, Resp: 20, Pain:         2, O2 sat: 100 on Room air, Time: 10/20/2011 09:45. (09:45 [name])     BP: 163/110 (Sitting), Pulse: 124, Resp: 20, Pain: 2, O2 sat: 100 on Room         air, Time: 10/20/2011 10:45. (10:45 [name])     BP: 161/107 (Sitting), Pulse: 99, Resp: 24, Pain: 3, O2 sat: 100 on Room         air, Time: 10/20/2011 11:30. (11:30 [name])      BP: 140/97 (Sitting), Pulse: 123, Resp: 27, Pain: 3, O2 sat: 100 on Room         air, Time: 10/20/2011 12:29. (12:29 AC)     BP: 136/96 (Lying), Pulse: 164, Resp: 18, Temp: 98.0 (Oral), Pain: 0, O2         sat: 97% on Room air, Time: 10/20/2011 12:49. (12:49 MNS1)     BP: 142/100 (Lying), Pulse: 39, Resp: 18, Temp: 98.2 (Oral), Pain: 0, O2         sat: 95 on Room air, Time: 10/20/2011 17:01. (17:01 RLA0)     BP: 142/100 (Lying), Pulse: 39, Resp: 28, Temp: 98.2 (Oral), Pain: 0, O2         sat: 91% on Room air, Time: 10/20/2011 17:03. (17:03 MNS1)     BP: 140/90 (Lying), Pulse: 110, Resp: 20, Temp: 98.1 (Oral), Pain: 59feet         pain, O2 sat: 97 on Room air, Time: 10/20/2011 19:51. (19:51         [name])     BP: 150/105 (Lying), Pulse: 106, Resp: 20, Temp: 98.5 (Oral), Pain: 26foot         pain, O2 sat: 96 on Room air, Time: 10/20/2011 22:00. (22:00         [name])     BP: 146/97 (Lying), Pulse: 97, Resp: 18, Temp: 97.9 (Oral), Pain: 0, O2         sat: 95 on Room air, Time: 10/20/2011 23:31. (23:31 [name])     BP: 140/100 (Sitting), Pulse: 110, Resp: 18, Temp: 98.7 (Oral), Pain: 0,         O2 sat: 96 on Room air, Time: 10/21/2011 02:16. (Thu Oct 21, 2011         02:16 [name])     BP: 132/85 (Lying), Pulse: 96, Resp: 18, Temp: 97.9 (Oral), Pain: 0, O2         sat: 97 on Room air, Time: 10/21/2011 03:45. (Thu Oct 21, 2011 03:45         [name])     BP: 143/87 (Lying), Pulse: 64, Resp: 18, Temp: 97.5 (Oral), Pain: 0, O2         sat: 97 on Room air, Time: 10/21/2011 06:16. (Thu Oct 21, 2011 06:16         MLW0)     BP: 140/107 (Lying), Pulse: 35, Resp: 18, Temp: 97.4 (Oral), Pain: 0, O2         sat:  100 on Room air, Time: 10/21/2011 11:14. Morey Hummingbird Oct 21, 2011 11:14         RLA0)     Pulse: 60, Time: 10/21/2011 11:16. (Thu Oct 21, 2011 11:16 RLA0)     BP: 139/105 (Sitting), Pulse: 53, Resp: 18, Temp: 97.5 (Oral), Pain: 0,         O2 sat: 98 on Room air, Time: 10/21/2011 13:01. Morey Hummingbird Oct 21, 2011         13:01 RLA0)        INSTRUCTION (Thu Oct 21, 2011 16:01 WCW)   DISCHARGE:  CHEST PAIN OF UNCLEAR ETIOLOGY, HYPERTENSION (HTN,         HYPERTENSION, HIGH BP, ELEVATED BP).   Elenor QuinonesRayetta Humphrey, MEDICINE, [address]         [place] [address], [phone].   SPECIAL:  Follow up with primary care physician within 1 week.         Take Labetalol as prescribed.         Return to the ER if condition worsens or new symptoms develop.       PRESCRIPTION (Thu Oct 21, 2011 15:59 WCW)   Labetalol Hydrochloride:  Tablet : 100 Mg : Oral : Quantity: ***         1 *** Unit: tab(s) Route: Oral Schedule: 2 times a day Dispense: ***         60 ***.   NOTES:  No refills          May use generic.       ADMIN   DIGITAL SIGNATURE:  Hyacinth Meeker, RN, Tammy. (09:37 [name])      Wilmer Floor, RN, Harriett Sine. (12:54 [name])      Dara Hoyer. Morey Hummingbird Oct 21, 2011 16:35 MNS1)   Key:     AC=Conley, RN, Marchelle Folks  [name]=Bermudez, RN, Amui  ARK1=King, ACT III,     Angela     [name]=Towns, PA-C, Eber Jones  [name]=Pitrolo, MD, Onalee Hua  [name]=Hsu, MD, [facility]     [name]=Himmel Salch, DO, Victorino Dike  JNO=Ogdin, RAD [facility], Dorene Sorrow  KMJ=Jones,     PA-C, Diannia Ruder     [name]=Mathews, MD, [facility]  MLW0=Warren, ACT III, Morrell  MNS1=Schwarga,     [facility]     [name]=Collins-Day, RN, Harriett Sine  RLA0=Allen, ACT III, Rachael  [name]=Miller,     RN, Tammy     WCW=Warren, PA-C, Chrissie Noa

## 2011-10-20 NOTE — Procedures (Signed)
Test Reason : Chest pain   Blood Pressure : ***/*** mmHG   Vent. Rate : 115 BPM     Atrial Rate : 127 BPM      P-R Int : 152 ms          QRS Dur : 088 ms       QT Int : 338 ms       P-R-T Axes : 024 046 049 degrees      QTc Int : 467 ms   Probable atrial tachycardia   Otherwise normal ECG   When compared with ECG of 25-Aug-2000 17:23,   No significant change since prior tracing   Confirmed by Miller, M.D., Jimmy ([id number]) on 10/20/2011 4:56:14 PM   Referred By:             Overread By: Jimmy Miller, M.D.

## 2011-10-20 NOTE — Procedures (Signed)
Test Reason : Chest pain   Blood Pressure : ***/*** mmHG   Vent. Rate : 112 BPM     Atrial Rate : 150 BPM      P-R Int : 000 ms          QRS Dur : 088 ms       QT Int : 348 ms       P-R-T Axes : 018 011 036 degrees      QTc Int : 475 ms   Sinus tachycardia With premature atrial complexes   Otherwise normal ECG   When compared with ECG of 20-Oct-2011 09:34, No significant change was found   Confirmed by Ashby, M.D., Charles Jr. ([id number]) on 10/21/2011 9:29:26 AM   Referred By:             Overread By: Charles J    Ashby, M.D.

## 2012-03-13 NOTE — Procedures (Signed)
Test Reason : Chest pain   Blood Pressure : ***/*** mmHG   Vent. Rate : 202 BPM     Atrial Rate : 141 BPM      P-R Int : 000 ms          QRS Dur : 082 ms       QT Int : 244 ms       P-R-T Axes : 000 000 082 degrees      QTc Int : 447 ms   Supraventricular tachycardia   Marked ST abnormality, possible inferior subendocardial injury   Abnormal ECG   When compared with ECG of 20-Oct-2011 17:16,   Supraventricular tachycardia has replaced Sinus rhythm   Vent. rate has increased BY  90 BPM   ST now depressed in Inferior leads   ST now depressed in Anterolateral leads   Confirmed by Hyacinth Meeker, M.D., Ramon Dredge [phone]) on 03/14/2012 8:38:55 AM   Referred By:             Overread By: Berton Blanding, M.D.

## 2012-03-13 NOTE — Procedures (Signed)
Test Reason : Chest pain   Blood Pressure : ***/*** mmHG   Vent. Rate : 097 BPM     Atrial Rate : 097 BPM      P-R Int : 142 ms          QRS Dur : 090 ms       QT Int : 362 ms       P-R-T Axes : 054 024 052 degrees      QTc Int : 459 ms   Normal sinus rhythm   Possible Left atrial enlargement   Borderline ECG   When compared with ECG of 20-Oct-2011 17:16,   No significant change was found   Confirmed by Hyacinth Meeker, M.D., Ramon Dredge ([id number]) on 03/14/2012 8:39:56 AM   Referred By:             Overread By: Berton [place], M.D.

## 2012-03-13 NOTE — Procedures (Signed)
Test Reason : Chest pain   Blood Pressure : ***/*** mmHG   Vent. Rate : 202 BPM     Atrial Rate : 141 BPM      P-R Int : 000 ms          QRS Dur : 082 ms       QT Int : 244 ms       P-R-T Axes : 000 000 082 degrees      QTc Int : 447 ms   Supraventricular tachycardia   Marked ST abnormality, possible inferior subendocardial injury   Abnormal ECG   When compared with ECG of 20-Oct-2011 17:16,   Supraventricular tachycardia has replaced Sinus rhythm   Vent. rate has increased BY  90 BPM   ST now depressed in Inferior leads   ST now depressed in Anterolateral leads   Confirmed by Miller, M.D., Edward ([id number]) on 03/14/2012 8:38:55 AM   Referred By:             Overread By: Edward Miller, M.D.

## 2012-03-13 NOTE — ED Provider Notes (Signed)
[facility]  EMERGENCY DEPARTMENT TREATMENT REPORT  NAME:  Roberto [name]  SEX:   M  ADMIT: 03/13/2012  DOB:   10-02-1960  MR#    [id number]  ROOM:    TIME SEEN: 12 04 PM  ACCT#  [id number]    cc: HAL BARNES MD    DATE OF SERVICE:   03/13/2012    CHIEF COMPLAINT:  Heart racing.    PRIMARY CARE PHYSICIAN:  Joannie Springs, MD     HISTORY OF PRESENT ILLNESS:  This is a 51 year old male who presents to the Emergency Department   complaining of his heart racing.  He states that this occurred just prior to   arrival.  He feels as though if he were to vomit he would feel better.    Describes sensation is at the bottom of his throat for these symptoms.  He has   no chest pain, no shortness of breath, no difficulty breathing.  He denies   any heavy alcohol use the night before.  He did have some alcohol use 2 nights   ago.  He did have a significant amount of caffeine use this morning.  He   states that he thinks he has had a similar episode in the past.  He is not   sure if he has ever been diagnosed with a condition called SVT.  Otherwise, he   has had no fevers, chills, nausea, vomiting, or diarrhea.    REVIEW OF SYSTEMS:  CONSTITUTIONAL:  No fever, chills, weight loss.   EYES: No visual symptoms.   ENT: No sore throat, runny nose, or other URI symptoms.   RESPIRATORY:  No cough, shortness of breath, or wheezing.   CARDIOVASCULAR:  Palpitations, no chest pain, no chest pressure.  GASTROINTESTINAL:  Some nausea, no vomiting or diarrhea.  GENITOURINARY:  No dysuria, frequency, or urgency.   MUSCULOSKELETAL:  No joint pain or swelling.   INTEGUMENTARY:  No rashes.   NEUROLOGICAL:  No headaches, sensory, or motor symptoms.     PAST MEDICAL HISTORY:  Hypertension, he has been off of his medications for the past 3 months   secondary to not being able to afford them with a recent insurance change.  He   has hyperlipidemia, arthritis.    PAST SURGICAL HISTORY:  Status post appendectomy.    SOCIAL HISTORY:   Currently he is a smoker.  He drinks alcohol multiple times a week.  He has a   history of using cocaine and marijuana, but none of these have been since last   Friday; which would be approximately 3 days ago.    ALLERGIES:  NO KNOWN DRUG ALLERGIES.    MEDICATIONS:  Suppose to be Diovan, he is unsure of the dose.  He cannot take it because he   cannot afford the prescriptions.    PHYSICAL EXAMINATION:  VITAL SIGNS:  Blood pressure 139/109, pulse is 200, respiration rate 14,   temperature 96% on room air.  GENERAL APPEARANCE:  Well-developed, well-nourished male who is resting on the   stretcher.  He is in no acute distress.  HEENT:  Eyes:  Conjunctivae clear, lids normal.  Pupils equal, symmetrical,   and normally reactive.  Mouth/Throat:  Surfaces of the pharynx, palate, and   tongue are pink, moist, and without lesions.   Nasal mucosa, septum, and   turbinates unremarkable.    RESPIRATORY:  Clear and equal breath sounds.  No respiratory distress,   tachypnea, or accessory muscle use.  CARDIOVASCULAR:  Regular, tachycardic, no audible murmurs, rubs, or gallops.    No peripheral edema or significant varicosities.   GASTROINTESTINAL:   Abdomen soft, nontender, without complaint of pain to   palpation.  No hepatomegaly or splenomegaly.   SKIN:  Warm and dry without rashes.   NEUROLOGIC:  Alert, oriented.  Sensation intact, motor strength equal and   symmetric.     INITIAL ASSESSMENT AND MANAGEMENT PLAN:   A 51 year old male.  EKG upon arrival shows that he is in a supraventricular   tachycardia with a rate in the 200s.  At this time he is quite comfortable in   no acute distress with his  SVT.  IV access was established.  He was placed on   cardiac pads, pulse oximetry, blood pressure and cardiac monitoring.  He was   bolused adenosine 6 mg times 1 with no effect.  He was then given 12 mg IV   bolus and had cardioversion to normal sinus rhythm.  His post-cardioversion    EKG showed normal sinus rhythm with no signs of ischemia.  There is no   significant depressions or elevations.  His symptoms completely resolved with   the feeling as though if he were to vomit his symptoms would improve.  He is   chest pain free and resting comfortably.  He was monitored and stayed in   normal sinus rhythm.  His repeat blood pressure continued to be elevated at   172/109 after cardioversion; therefore, he was given clonidine 0.1 mg orally.    I had a long discussion with the patient.  He states that his insurance   changed, he can no longer take Diovan.  He has not been back to Dr. Zachery Dauer to   discuss changing his medications.  He has significant reactions with Dyazide   and all the other diuretics and does not want to take those.  Therefore, I   have offered to write him for 2 weeks of clonidine 0.1 mg for blood pressure   management until he can get a hold of Dr. Zachery Dauer and discuss more appropriate   hypertension management.  I will also give him the number to cardiology   regarding the SVT should he have recurrence.  I have told him to avoid any   caffeine intake, stay well hydrated.  I suspect this SVT episode may have been   secondary to dehydration as well.  He is hemoconcentrated with hematocrit of   51.3 with an alcohol history, all of those I believe could potentially play   into this factor.  He states that he has not done cocaine but that also could   potentially cause this episode.  He states he has not done cocaine in the past   24 hours, but could also precipitate this episode.      CONTINUATION BY Jannetta Quint, MD    EMERGENCY DEPARTMENT COURSE:     The patient received clonidine 0.1 mg orally.  I repeated his blood pressure   approximately 1 hour after the clonidine, and his blood pressure was 133/99.    The patient will be discharged on clonidine 0.1 mg twice a day until he can   follow up with Dr. Zachery Dauer.  He states that he will follow up with Dr. Zachery Dauer.     We have given him 2 weeks of medications for the hypertension.  I have asked   him to avoid caffeine, cocaine and binge drinking in order to avoid him  having   recurrent episodes of SVT.  I have given him the number to Dr. Cleon Gustin for   [facility] (cardiology) for followup.    EMERGENCY DEPARTMENT DIAGNOSES:    1.  Supraventricular tachycardia.    2.  Uncontrolled hypertension.    PLAN:   As above.  I have asked the patient to return to the Emergency Department for   return of symptoms.  We did discuss vagal maneuvers to abort the SVT.  I have   asked him to return for chest pain, shortness of breath, or other acute   changes.     ADDENDUM:   I did review the patient's stress test in 01/2012 here in our hospital which   was completely negative.  The patient had no chest pain and no shortness of   breath.  Cardiac enzymes are not warranted for this condition.      ___________________  Lucio Edward MD  Dictated By: Marland Kitchen     My signature above authenticates this document and my orders, the final   diagnosis (es), discharge prescription (s), and instructions in the [facility]   Pulsecheck record.  DH2  D:03/13/2012  T: 03/13/2012 16:14:43  [id number]  Authenticated by Lucio Edward, MD On 03/22/2012 09:12:07 PM

## 2012-03-13 NOTE — Procedures (Signed)
Test Reason : Chest pain   Blood Pressure : ***/*** mmHG   Vent. Rate : 097 BPM     Atrial Rate : 097 BPM      P-R Int : 142 ms          QRS Dur : 090 ms       QT Int : 362 ms       P-R-T Axes : 054 024 052 degrees      QTc Int : 459 ms   Normal sinus rhythm   Possible Left atrial enlargement   Borderline ECG   When compared with ECG of 20-Oct-2011 17:16,   No significant change was found   Confirmed by Miller, M.D., Edward ([id number]) on 03/14/2012 8:39:56 AM   Referred By:             Overread By: Edward Miller, M.D.

## 2012-03-13 NOTE — ED Provider Notes (Signed)
MEDICATION ADMINISTRATION SUMMARY         Drug Name: CloNIDine Hydrochloride, Dose Ordered: 0.1 mg, Route:         Oral, Status: Given, Time: 11:41 03/13/2012,         Drug Name: *Sodium Chloride 0.9%, Intravenous, Dose Ordered: 1000 mL,         Route: IV Fluid, Status: Given, Time: 10:58 03/13/2012,         Drug Name: Adenosine, Dose Ordered: 12 mg, Route: IV Push, Status:         Given, Time: 10:52 03/13/2012,         Drug Name: Adenosine, Dose Ordered: 6 mg, Route: IV Push, Status:         Given, Time: 10:50 03/13/2012, *Additional information available in         notes, Detailed record available in Medication Service section.   KNOWN ALLERGIES   NKDA   TRIAGE [facility] Mar 13, 2012 10:47 [facility])   PATIENT: NAME: Roberto [name], [place]: male, DOB: Fri Mar 21, 1961, LANGUAGE: English, HEIGHT: 182cm. [facility] Mar 13, 2012 10:47 [facility])   ADMISSION: URGENCY: 1, TRANSPORT: Ambulatory, DEPT: Emergency,         BED: 2ED 22. [facility] Mar 13, 2012 10:47 [facility]   COMPLAINT:  SVT. (Mon Mar 13, 2012 10:47 [facility])   PRESENTING COMPLAINT:  SVT. (11:05 AC)   TB SCREENING: TB screen negative for this patient. (11:05 AC)   ABUSE SCREENING: Patient denies physical abuse or threats. (11:05         AC)   FALL RISK: Patient has a high risk of falling. (11:05 AC)   SUICIDAL IDEATION: Suicidal ideation is not present. (11:05 AC)   ADVANCE DIRECTIVES: Patient does not have advance directives.         (11:05 AC)   PROVIDERS: TRIAGE NURSE: Leane Platt, RN. (Mon Mar 13, 2012         10:47 [facility])   PREVIOUS VISIT ALLERGIES: NKDA. (11:05 AC)   PRESENTING PROBLEM Roberto [name] Mar 13, 2012 10:47 [facility])      Presenting problems: Tachycardia.   CURRENT MEDICATIONS (11:01 AC)   Diovan : Tablet : 40 Mg : Oral:  ??. pt states he has not taken         his bp meds.   MEDICATION SERVICE   Adenosine:  Order: Adenosine - Dose: 12 mg : IV Push         Ordered by: Jannetta Quint, M.D.         Entered by: Jannetta Quint, M.D. Mon Mar 13, 2012 10:55          Documented as given by: Leane Platt, RN Mon Mar 13, 2012 10:52         Patient, Medication, Dose, Route and Time verified prior to         administration.          IV SITE #1 into right antecubital, IV SITE #1 IVP, initial         medication, Slowly, Connections checked prior to administration, Line         traced prior to administration, Catheter placement confirmed via         flush prior to administration, IV site without signs or symptoms of         infiltration during medication administration, No swelling during         administration, No drainage during administration, IV flushed after  administration, Correct patient, time, route, dose and medication         confirmed prior to administration, Patient advised of actions and         side-effects prior to administration, Allergies confirmed and         medications reviewed prior to administration.   Adenosine:  Order: Adenosine - Dose: 6 mg : IV Push         Ordered by: Jannetta Quint, M.D.         Entered by: Jannetta Quint, M.D. Mon Mar 13, 2012 10:55         Documented as given by: Leane Platt, RN Mon Mar 13, 2012 10:50         Patient, Medication, Dose, Route and Time verified prior to         administration.          IV SITE #1 into right antecubital, IV SITE #1 IVP, initial         medication, Rapidly, Connections checked prior to administration,         Line traced prior to administration, Catheter placement confirmed via         flush prior to administration, IV site without signs or symptoms of         infiltration during medication administration, No swelling during         administration, No drainage during administration, IV flushed after         administration, Correct patient, time, route, dose and medication         confirmed prior to administration, Patient advised of actions and         side-effects prior to administration, Allergies confirmed and         medications reviewed prior to administration.    CloNIDine Hydrochloride:  Order: CloNIDine Hydrochloride         (Clonidine Hydrochloride) - Dose: 0.1 mg : Oral         Ordered by: Jannetta Quint, M.D.         Entered by: Jannetta Quint, M.D. Mon Mar 13, 2012 11:29 ,         Acknowledged by: Mosie Lukes) Benna Dunks, RN STAT Mon Mar 13, 2012         11:38         Documented as given by: Mosie Lukes) Benna Dunks, RN STAT Mon Mar 13, 2012 11:41         Patient, Medication, Dose, Route and Time verified prior to         administration.          Amount given: 0.1 MG, Site: Medication administered P.O., Correct         patient, time, route, dose and medication confirmed prior to         administration, Patient advised of actions and side-effects prior to         administration, Allergies confirmed and medications reviewed prior to         administration, Patient in position of comfort, Side rails up, Cart         in lowest position, Call light in reach.   Sodium Chloride 0.9%, Intravenous:  Order: Sodium Chloride 0.9%,         Intravenous (Sodium Chloride) - Dose: 1000 mL : IV Fluid         Notes: Bolus         *Reminder* Enter reason for fluid  below         For Hydration         Ordered by: Jannetta Quint, M.D.         Entered by: Jannetta Quint, M.D. Mon Mar 13, 2012 10:55         Documented as given by: Leane Platt, RN Mon Mar 13, 2012 10:58         Patient, Medication, Dose, Route and Time verified prior to         administration.          IV SITE #1 into right antecubital, IV SITE #1 IV fluids established,         IV SITE #1 1st bag hung, amount 1 Liter, IV SITE #1 bolus of 1000 ml         established, IV SITE #1 Rate of bolus, wide open, via primary tubing,         Connections checked prior to administration, Line traced prior to         administration, Catheter placement confirmed via flush prior to         administration, IV site without signs or symptoms of infiltration         during medication administration, No swelling during administration,          No drainage during administration, Correct patient, time, route, dose         and medication confirmed prior to administration, Patient advised of         actions and side-effects prior to administration, Allergies confirmed         and medications reviewed prior to administration.   : Follow Up : _IV SITE #1:_, IV fluid infusion discontinued, on         Mon Mar 13, 2012 12:00, Total fluid hydration time IV site 1 1 hour,         5 minutes, . (13:45 TRF0)   ORDERS   BASIC METABOLIC PANEL:  Ordered for: Buddy Duty, M.D., Toni Amend         Status: Canceled by: Katherine Roan, RN, Blanche [place] Mar 13, 2012 10:59.         (10:54 CTZ0)   CBC, WITH DIFFERENTIAL:  Ordered for: Buddy Duty, M.D., Courtney         Status: Done by: System - Mon Mar 13, 2012 11:39. (10:54 CTZ0)   BASIC METABOLIC PANEL:  Ordered forBuddy Duty, M.D., Courtney         Status: Done by: System - Mon Mar 13, 2012 11:31. (11:00 AC)   12 LEAD EKG:  Ordered for: Buddy Duty, M.D., Courtney         Status: Active. (11:05 JAG3)   12 LEAD EKG:  Ordered for: Buddy Duty, M.D., Toni Amend         Status: Active. (11:05 JAG3)   Rpeat BP 30 minutes after clonidine for disposition:  Ordered         for: Buddy Duty, M.D., Courtney         Status: Done by: Sullivan Lone, RN, [facility] Mar 13, 2012 12:14.         (12:08 CTZ0)   NURSING ASSESSMENT: CV WITH PROCEDURES (10:58 AC)   CONSTITUTIONAL: History obtained from patient, Patient arrives,         via hospital wheelchair, Gait steady, Patient appears comfortable,         Patient cooperative, Patient alert, Oriented to person, place and         time, Skin warm,  Skin dry, Skin normal in color, Mucous membranes         pink, Mucous membranes moist, Patient is well-groomed.   PAIN: pressure pain, to the left chest, on a scale 0-10 patient         rates pain as 3.   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   RESPIRATORY/CHEST: Respiratory assessment findings include         respiratory effort easy, Respirations regular, Conversing normally,         Breath sounds clear, Neck and chest exam findings include trachea         midline, Chest expansion equal, Chest movement symmetrical, no         associated cough noted, no associated fever.   IV: IV therapy indicated for hydration, IV therapy indicated for         medication administration, IV established, to the right antecubital,         using a 20 gauge catheter, in one attempt, Saline lock established,         Labs drawn at time of placement, Specimens labeled in the presence of         the patient, Tourniquet removed from patient after procedure., Notes:         by jen g.   CARDIAC MONITOR: Cardiac monitoring indicated for complaint of         chest pain, Cardiac monitoring indicated for SVT, Patient placed on         cardiac monitor, Heart rate: 190, showing paroxysmal supraventricular         tachycardia, Patient placed on non-invasive blood pressure monitor,         with disposable blood pressure cuff applied, Patient placed on         continuous pulse oximetry, Adult/pediatric oxisensor applied, on 2L,         via nasal cannula applied.   OXYGEN THERAPY: Oxygen therapy indicated for chest pain, Prior to         procedure, breath sounds clear, Oxygen saturation 98%, by         adult/pediatric oxisensor, 2L oxygen given, via nasal cannula         applied, via nasal cannula.   SAFETY: Side rails up, Cart/Stretcher in lowest position, Call          light within reach, Hospital ID band on.   NURSING PROCEDURE: DISCHARGE NOTE (13:45 TRF0)   DISCHARGE: Patient discharged to home, ambulating without         assistance, driving self, unaccompanied, Discharge instructions given         to patient, Simple or moderate discharge teaching performed, by Lamount Cranker, RN, Prescriptions given and instructions on side effects given,  Name of prescription(s) given: clonadine, Above person(s) verbalized         understanding of discharge instructions and follow-up care.   NURSING PROCEDURE: EKG CHART (11:07 JAG3)   PATIENT IDENTIFIER: Patient's identity verified by patient         stating name, Patient's identity verified by patient stating birth         date, Patient's identity verified by hospital ID bracelet.     Patient's identity verified by patient stating name, Patient's identity         verified by patient stating birth date, Patient's identity verified         by hospital ID bracelet.   EKG: EKG indicated for complaint of chest pain, 12 lead EKG         performed on the left chest, first EKG, Notes: done at 1043.     EKG indicated for complaint of chest pain, 12 lead EKG performed on the         left chest, second EKG.   FOLLOW-UP: After procedure, EKG for interpretation given to Dr.         Buddy Duty, EKG was given to Dr. at 1054.     After procedure, EKG for interpretation given to Dr. Buddy Duty, EKG was         given to Dr. at 1054.   NURSING PROCEDURE: URINE COLLECTION (12:14 JSB1)   PATIENT IDENTIFIER: Patient's identity verified by patient         stating name, Patient's identity verified by patient stating birth         date.   URINE COLLECTION MALE: Urine collected by void, output amount         (mL) 850cc, urine yellow in color, and clear.   DIAGNOSIS (13:32 CTZ0)   FINAL: PRIMARY: SVT, ADDITIONAL: Uncontrolled Hypertension.   DISPOSITION   PATIENT:  Disposition Type: Discharged, Disposition: Discharge,         Condition: Stable. (13:32 CTZ0)       Patient left the department. (13:46 TRF0)   VITAL SIGNS   VITAL SIGNS: BP: 139/109 (Lying), Pulse: 200, Resp: 14, Pain: 3,         O2 sat: 96 on Room air, Time: 03/13/2012 10:49. (10:51 AC)     BP: 184/121 (Lying), Pulse: 94, Resp: 16, Pain: 5, O2 sat: 97 on 2L O2         NC, Time: 03/13/2012 10:53. (10:54 AC)     Temp: 97.9 (Oral), Time: 03/13/2012 11:03. (11:03 AC)     BP: 172/109 (Lying), Pulse: 94, Resp: 22, O2 sat: 97% on 2L O2 NC, Time:         03/13/2012 11:42. (11:42 JSB1)     BP: 145/101 (Lying), Time: 03/13/2012 12:14. (12:14 JAG3)     BP: 146/103 (Lying), Time: 03/13/2012 13:06. (13:07 JAG3)     BP: 133/99 (Lying), Pulse: 71, Resp: 14, Pain: 0, O2 sat: 96% on Room         air, Time: 03/13/2012 13:37. (13:39 JSB1)   INSTRUCTION (13:34 CTZ0)   DISCHARGE:  SUPRAVENTRICULAR TACHYCARDIA (SVT, PSVT), ELEVATED         BLOOD PRESSURE (HTN, HYPERTENSION, HIGH BP, ELEVATED BP).   FOLLOWUPKaty Apo [place] [address], [phone].   SPECIAL:  Please follow-up with Dr. Zachery Dauer for evaluation,         management and treatment of your hypertension. Please call today for  an appointment. You need to follow-up with cardiology regarding your         episode of SVT. You need to avoid caffeine, binge drinking alcohol         and cocaine.   EVENTS   TRANSFER:  Triage to Emergency Main 22. (Mon Mar 13, 2012 10:47         [facility])      Removed from Emergency Main 22. (13:46 TRF0)   PRESCRIPTION (13:33 CTZ0)   CloNIDine Hydrochloride:  Tablet : 0.1 Mg : Oral : Quantity: ***         1 *** Unit: tab(s) Route: Oral Schedule: 2 times a day Dispense: ***         [id number] ***.   NOTES:  No refills          May use generic.   ADMIN (11:07 JAG3)   DIGITAL SIGNATURE:  Sullivan Lone, RN, Victorino Dike.   Key:     AC=Conley, RN, Marchelle Folks CTZ0=Zydron, M.D., Toni Amend JAG3=Gilbert, RN,     Victorino Dike     JSB1=Bailie, PM (JIM), Fayrene Fearing TRF0=Fitz, (TY), RN, Fabienne Bruns

## 2016-01-26 ENCOUNTER — Encounter (HOSPITAL_COMMUNITY): Payer: Self-pay | Admitting: Emergency Medicine

## 2016-01-26 ENCOUNTER — Emergency Department (HOSPITAL_COMMUNITY)
Admission: EM | Admit: 2016-01-26 | Discharge: 2016-01-26 | Disposition: A | Discharge status: Home / Self Care | Payer: BLUE CROSS/BLUE SHIELD | Attending: Emergency Medicine | Admitting: Emergency Medicine

## 2016-01-26 ENCOUNTER — Emergency Department (HOSPITAL_COMMUNITY): Payer: BLUE CROSS/BLUE SHIELD

## 2016-01-26 DIAGNOSIS — Z7982 Long term (current) use of aspirin: Secondary | ICD-10-CM | POA: Insufficient documentation

## 2016-01-26 DIAGNOSIS — M79675 Pain in left toe(s): Secondary | ICD-10-CM | POA: Insufficient documentation

## 2016-01-26 DIAGNOSIS — F17200 Nicotine dependence, unspecified, uncomplicated: Secondary | ICD-10-CM | POA: Insufficient documentation

## 2016-01-26 DIAGNOSIS — I10 Essential (primary) hypertension: Secondary | ICD-10-CM | POA: Diagnosis not present

## 2016-01-26 HISTORY — DX: Essential (primary) hypertension: I10

## 2016-01-26 LAB — BASIC METABOLIC PANEL
ANION GAP: 10 (ref 5–15)
BUN: 11 mg/dL (ref 6–20)
CALCIUM: 8.7 mg/dL — AB (ref 8.9–10.3)
CO2: 19 mmol/L — AB (ref 22–32)
CREATININE: 1.28 mg/dL — AB (ref 0.61–1.24)
Chloride: 103 mmol/L (ref 101–111)
GFR calc Af Amer: >60 mL/min (ref >60)
GFR calc non Af Amer: >60 mL/min (ref >60)
GLUCOSE: 106 mg/dL — AB (ref 65–99)
Potassium: 3.6 mmol/L (ref 3.5–5.1)
Sodium: 132 mmol/L — ABNORMAL LOW (ref 135–145)

## 2016-01-26 LAB — CBC WITH DIFFERENTIAL/PLATELET
BASOS PCT: 0 %
Basophils Absolute: 0 10*3/uL (ref 0.0–0.1)
Eosinophils Absolute: 0.1 10*3/uL (ref 0.0–0.7)
Eosinophils Relative: 1 %
HEMATOCRIT: 45.7 % (ref 39.0–52.0)
Hemoglobin: 14.8 g/dL (ref 13.0–17.0)
Lymphocytes Relative: 13 %
Lymphs Abs: 2 10*3/uL (ref 0.7–4.0)
MCH: 28.2 pg (ref 26.0–34.0)
MCHC: 32.4 g/dL (ref 30.0–36.0)
MCV: 87.2 fL (ref 78.0–100.0)
MONO ABS: 1.4 10*3/uL — AB (ref 0.1–1.0)
MONOS PCT: 9 %
NEUTROS ABS: 12 10*3/uL — AB (ref 1.7–7.7)
Neutrophils Relative %: 77 %
Platelets: 178 10*3/uL (ref 150–400)
RBC: 5.24 MIL/uL (ref 4.22–5.81)
RDW: 13.5 % (ref 11.5–15.5)
WBC: 15.4 10*3/uL — ABNORMAL HIGH (ref 4.0–10.5)

## 2016-01-26 MED ORDER — CARVEDILOL 12.5 MG PO TABS
12.5000 mg | ORAL_TABLET | Freq: Two times a day (BID) | ORAL | Status: DC
  Administered 2016-01-26: 12.5 mg via ORAL
  Filled 2016-01-26: qty 1

## 2016-01-26 MED ORDER — LISINOPRIL 20 MG PO TABS
40.0000 mg | ORAL_TABLET | Freq: Once | ORAL | Status: AC
  Administered 2016-01-26: 40 mg via ORAL
  Filled 2016-01-26: qty 2

## 2016-01-26 MED ORDER — HYDROCODONE-ACETAMINOPHEN 5-325 MG PO TABS
1.0000 | ORAL_TABLET | ORAL | Status: AC
  Administered 2016-01-26: 1 via ORAL
  Filled 2016-01-26: qty 1

## 2016-01-26 MED ORDER — INDOMETHACIN 25 MG PO CAPS: 25.0000 mg | ORAL_CAPSULE | Freq: Three times a day (TID) | ORAL | 0 refills | Status: AC | PRN

## 2016-01-26 NOTE — ED Provider Notes (Signed)
[facility]-EMER[facility]ENCY DEPT Provider Note   CSN: [id number] Arrival date & time: 01/26/16  0700  First Provider Contact:  First MD Initiated Contact with Patient 01/26/16 0732        History   Chief Complaint Chief Complaint  Patient presents with  . Foot Pain  . Hypertension    HPI Allen Porter is a 55 y.o. male.  Patient presents to the emergency room with complaints of left foot pain. Patient does not recall any injuries. He woke up this morning and noticed severe pain in his left big toe. Denies any fevers or chills. No numbness or weakness.  Patient also has a history of hypertension. He normally has blood pressures running in the 180s over 100s. He Does not think that he took his blood pressure medications on Sunday and is not sure if he took them on Saturday. He did not take his medication this morning Because the pain that we was Having in his toe threw him out of his usual routine.   The history is provided by the patient.  Foot Pain  This is a new problem. The problem occurs constantly. The problem has not changed since onset.Pertinent negatives include no chest pain, no abdominal pain, no headaches and no shortness of breath. The symptoms are aggravated by walking and standing (movement and palpation). Nothing relieves the symptoms. He has tried nothing for the symptoms. The treatment provided no relief.  Hypertension  Pertinent negatives include no chest pain, no abdominal pain, no headaches and no shortness of breath.    Past Medical History:  Diagnosis Date  . Hypertension     There are no active problems to display for this patient.   Past Surgical History:  Procedure Laterality Date  . APPENDECTOMY         Home Medications    Prior to Admission medications   Medication Sig Start Date End Date Taking? Authorizing Provider  aspirin 81 MG chewable tablet Chew 81 mg by mouth daily.   Yes Historical Provider, MD  carvedilol (COREG) 12.5 MG tablet Take 12.5 mg  by mouth 2 (two) times daily. 12/24/14  Yes Historical Provider, MD  lisinopril (PRINIVIL,ZESTRIL) 40 MG tablet Take 40 mg by mouth daily. 12/24/14  Yes Historical Provider, MD  therapeutic multivitamin-minerals (THERAGRAN-M) tablet Take 1 tablet by mouth daily.   Yes Historical Provider, MD  indomethacin (INDOCIN) 25 MG capsule Take 1 capsule (25 mg total) by mouth 3 (three) times daily as needed. 01/26/16   [name], MD  Meds pulled from Epic Outside records lisinopril (PRINIVIL) 40 mg PO TABS  Indications: Essential hypertension, benign Take 1 Tab by Mouth Once a Day. For hypertension 30 Tab  5 12/24/2014  Active  carvedilol (COREG) 12.5 mg PO TABS  Indications: Essential hypertension, benign, Paroxysmal SVT (supraventricular tachycardia) (HCC) Take 1 Tab by Mouth Twice Daily. For hypertension and SVT 60 Tab  5 12/24/2014  Active     Family History No family history on file.  Social History Social History  Substance Use Topics  . Smoking status: Current Every Day Smoker  . Smokeless tobacco: Not on file  . Alcohol use Yes     Allergies   Hctz [hydrochlorothiazide]   Review of Systems Review of Systems  Respiratory: Negative for shortness of breath.   Cardiovascular: Negative for chest pain.  Gastrointestinal: Negative for abdominal pain.  Musculoskeletal: Negative for back pain and joint swelling.  Skin: Negative for color change, pallor, rash and wound.  Neurological: Negative for  headaches.  All other systems reviewed and are negative.    Physical Exam Updated Vital Signs BP (!) 151/105   Pulse 63   Temp 97.5 F (36.4 C) (Oral)   Resp 19   Ht 6' (1.829 m)   Wt 95.3 kg   SpO2 97%   BMI 28.48 kg/m   Physical Exam  Constitutional: He appears well-developed and well-nourished. No distress.  HENT:  Head: Normocephalic and atraumatic.  Right Ear: External ear normal.  Left Ear: External ear normal.  Eyes: Conjunctivae are normal. Right eye exhibits no  discharge. Left eye exhibits no discharge. No scleral icterus.  Neck: Neck supple. No tracheal deviation present.  Cardiovascular: Normal rate, regular rhythm and intact distal pulses.   Pulmonary/Chest: Effort normal and breath sounds normal. No stridor. No respiratory distress. He has no wheezes. He has no rales.  Abdominal: Soft. Bowel sounds are normal. He exhibits no distension. There is no tenderness. There is no rebound and no guarding.  Musculoskeletal: He exhibits no edema.       Left ankle: Normal.       Left lower leg: Normal. He exhibits no tenderness and no swelling.       Left foot: There is tenderness and bony tenderness. There is normal range of motion, no swelling, normal capillary refill, no deformity and no laceration.  Neurological: He is alert. He has normal strength. No cranial nerve deficit (no facial droop, extraocular movements intact, no slurred speech) or sensory deficit. He exhibits normal muscle tone. He displays no seizure activity. Coordination normal.  Skin: Skin is warm and dry. No rash noted.  Psychiatric: He has a normal mood and affect.  Nursing note and vitals reviewed.    ED Treatments / Results  Labs (all labs ordered are listed, but only abnormal results are displayed) Labs Reviewed  BASIC METABOLIC PANEL - Abnormal; Notable for the following:       Result Value   Sodium 132 (*)    CO2 19 (*)    Glucose, Bld 106 (*)    Creatinine, Ser 1.28 (*)    Calcium 8.7 (*)    All other components within normal limits  CBC WITH DIFFERENTIAL/PLATELET - Abnormal; Notable for the following:    WBC 15.4 (*)    Neutro Abs 12.0 (*)    Monocytes Absolute 1.4 (*)    All other components within normal limits    EKG  EKG Interpretation  Date/Time:  Monday January 26 2016 07:57:02 EDT Ventricular Rate:  80 PR Interval:    QRS Duration: 94 QT Interval:  399 QTC Calculation: 461 R Axis:   50 Text Interpretation:  Sinus rhythm Short PR interval Borderline  repolarization abnormality No old tracing to compare Confirmed by Trequan Marsolek  MD-J, Ambrielle Kington ([id number]) on 01/26/2016 9:03:29 AM       Radiology Dg Foot Complete Left  Result Date: 01/26/2016 CLINICAL DATA:  Pain medially for 1 day.  No history of trauma EXAM: LEFT FOOT - COMPLETE 3+ VIEW COMPARISON:  None. FINDINGS: Frontal, oblique, and lateral views were obtained. There is no demonstrable fracture or dislocation. The joint spaces appear normal. No erosive change. There is a spur arising from the inferior calcaneus. IMPRESSION: Inferior calcaneal spur. No appreciable joint space narrowing or erosion. No acute fracture or dislocation. Electronically Signed   By: Bretta Bang III M.D.   On: 01/26/2016 08:15   Procedures Procedures (including critical care time)  Medications Ordered in ED Medications  carvedilol (COREG) tablet 12.5  mg (12.5 mg Oral Given 01/26/16 0753)  lisinopril (PRINIVIL,ZESTRIL) tablet 40 mg (40 mg Oral Given 01/26/16 0753)  HYDROcodone-acetaminophen (NORCO/VICODIN) 5-325 MG per tablet 1 tablet (1 tablet Oral Given 01/26/16 0753)     Initial Impression / Assessment and Plan / ED Course  I have reviewed the triage vital signs and the nursing notes.  Pertinent labs & imaging results that were available during my care of the patient were reviewed by me and considered in my medical decision making (see chart for details).  Clinical Course  Comment By Time  Asymptomatic hypertension.  Will check, labs, EKG.  Ordered a dose of his home medications.  Will xray foot [name], MD 07/31 [phone]  Patient has an elevated creatinine. This could be related to his hypertension or may be chronic. He also does have an elevated white blood cell count but exam does not suggest infection.   [name], MD 07/31 0900  Blood pressure is improving [name], MD 07/31 [phone]    Patient's blood pressure is improved with treatment. No evidence to suggest end organ ischemia or any acute complications  associated with his hypertension. I discussed taking his medications regularly. I'll also have him follow up with his primary doctor regarding his elevated creatinine.  Patient's toe pain could be related to a gout flare however he has never had anything like this before. Also possible the patient kicked something or stubbed his toe in the middle the night. He thinks was the most likely issue is he has done this before.  X-rays do not show any fracture. I doubt any acute infection. I  will treat with a short course of Indocin.  Final Clinical Impressions(s) / ED Diagnoses   Final diagnoses:  Toe pain, left  Essential hypertension    New Prescriptions New Prescriptions   INDOMETHACIN (INDOCIN) 25 MG CAPSULE    Take 1 capsule (25 mg total) by mouth 3 (three) times daily as needed.     [name], MD 01/26/16 [phone]

## 2016-01-26 NOTE — Discharge Instructions (Signed)
Follow-up with your primary care doctor regarding her blood pressure, remember to take her medications regularly. Take the medications for pain as needed. Monitor for fevers chills or worsening symptoms.

## 2016-01-26 NOTE — ED Triage Notes (Signed)
Pt. woke up this morning with left foot pain , denies injury , pain increases with movement , hypertensive at triage - pt. stated he has not taken his antihypertensive medication for 2 days .

## 2016-02-26 ENCOUNTER — Emergency Department (HOSPITAL_COMMUNITY): Payer: BLUE CROSS/BLUE SHIELD

## 2016-02-26 ENCOUNTER — Observation Stay (HOSPITAL_COMMUNITY)
Admission: EM | Admit: 2016-02-26 | Discharge: 2016-02-27 | Disposition: A | Discharge status: Home / Self Care | Payer: BLUE CROSS/BLUE SHIELD | Attending: Internal Medicine | Admitting: Internal Medicine

## 2016-02-26 ENCOUNTER — Encounter (HOSPITAL_COMMUNITY): Payer: Self-pay | Admitting: *Deleted

## 2016-02-26 DIAGNOSIS — I4891 Unspecified atrial fibrillation: Principal | ICD-10-CM | POA: Diagnosis present

## 2016-02-26 DIAGNOSIS — I471 Supraventricular tachycardia: Secondary | ICD-10-CM | POA: Insufficient documentation

## 2016-02-26 DIAGNOSIS — F1410 Cocaine abuse, uncomplicated: Secondary | ICD-10-CM | POA: Insufficient documentation

## 2016-02-26 DIAGNOSIS — R7989 Other specified abnormal findings of blood chemistry: Secondary | ICD-10-CM

## 2016-02-26 DIAGNOSIS — F1910 Other psychoactive substance abuse, uncomplicated: Secondary | ICD-10-CM | POA: Diagnosis present

## 2016-02-26 DIAGNOSIS — R778 Other specified abnormalities of plasma proteins: Secondary | ICD-10-CM

## 2016-02-26 DIAGNOSIS — F17200 Nicotine dependence, unspecified, uncomplicated: Secondary | ICD-10-CM | POA: Diagnosis not present

## 2016-02-26 DIAGNOSIS — Z7982 Long term (current) use of aspirin: Secondary | ICD-10-CM | POA: Insufficient documentation

## 2016-02-26 DIAGNOSIS — I10 Essential (primary) hypertension: Secondary | ICD-10-CM | POA: Diagnosis not present

## 2016-02-26 DIAGNOSIS — R079 Chest pain, unspecified: Secondary | ICD-10-CM

## 2016-02-26 DIAGNOSIS — Z23 Encounter for immunization: Secondary | ICD-10-CM | POA: Insufficient documentation

## 2016-02-26 HISTORY — DX: Paroxysmal atrial fibrillation: I48.0

## 2016-02-26 LAB — BASIC METABOLIC PANEL
Anion gap: 11 (ref 5–15)
BUN: 17 mg/dL (ref 6–20)
CO2: 20 mmol/L — ABNORMAL LOW (ref 22–32)
CREATININE: 1.23 mg/dL (ref 0.61–1.24)
Calcium: 9.7 mg/dL (ref 8.9–10.3)
Chloride: 105 mmol/L (ref 101–111)
GFR calc Af Amer: >60 mL/min (ref >60)
Glucose, Bld: 133 mg/dL — ABNORMAL HIGH (ref 65–99)
Potassium: 4 mmol/L (ref 3.5–5.1)
SODIUM: 136 mmol/L (ref 135–145)

## 2016-02-26 LAB — COMPREHENSIVE METABOLIC PANEL
ALBUMIN: 3.4 g/dL — AB (ref 3.5–5.0)
ALK PHOS: 69 U/L (ref 38–126)
ALT: 17 U/L (ref 17–63)
ANION GAP: 9 (ref 5–15)
AST: 18 U/L (ref 15–41)
BILIRUBIN TOTAL: 0.8 mg/dL (ref 0.3–1.2)
BUN: 14 mg/dL (ref 6–20)
CALCIUM: 8.9 mg/dL (ref 8.9–10.3)
CO2: 24 mmol/L (ref 22–32)
Chloride: 105 mmol/L (ref 101–111)
Creatinine, Ser: 1 mg/dL (ref 0.61–1.24)
GFR calc Af Amer: >60 mL/min (ref >60)
Glucose, Bld: 108 mg/dL — ABNORMAL HIGH (ref 65–99)
POTASSIUM: 3.8 mmol/L (ref 3.5–5.1)
Sodium: 138 mmol/L (ref 135–145)
TOTAL PROTEIN: 5.8 g/dL — AB (ref 6.5–8.1)

## 2016-02-26 LAB — CBC WITH DIFFERENTIAL/PLATELET
BASOS ABS: 0 10*3/uL (ref 0.0–0.1)
BASOS PCT: 0 %
EOS PCT: 0 %
Eosinophils Absolute: 0 10*3/uL (ref 0.0–0.7)
HCT: 43.2 % (ref 39.0–52.0)
Hemoglobin: 14.1 g/dL (ref 13.0–17.0)
LYMPHS PCT: 17 %
Lymphs Abs: 2.2 10*3/uL (ref 0.7–4.0)
MCH: 28.7 pg (ref 26.0–34.0)
MCHC: 32.6 g/dL (ref 30.0–36.0)
MCV: 87.8 fL (ref 78.0–100.0)
MONO ABS: 1.2 10*3/uL — AB (ref 0.1–1.0)
Monocytes Relative: 10 %
NEUTROS ABS: 9.1 10*3/uL — AB (ref 1.7–7.7)
Neutrophils Relative %: 73 %
PLATELETS: 180 10*3/uL (ref 150–400)
RBC: 4.92 MIL/uL (ref 4.22–5.81)
RDW: 13.9 % (ref 11.5–15.5)
WBC: 12.5 10*3/uL — AB (ref 4.0–10.5)

## 2016-02-26 LAB — RAPID URINE DRUG SCREEN, HOSP PERFORMED
Amphetamines: NOT DETECTED
Barbiturates: NOT DETECTED
Benzodiazepines: NOT DETECTED
Cocaine: POSITIVE — AB
OPIATES: NOT DETECTED
TETRAHYDROCANNABINOL: NOT DETECTED

## 2016-02-26 LAB — URINALYSIS, ROUTINE W REFLEX MICROSCOPIC
BILIRUBIN URINE: NEGATIVE
Glucose, UA: NEGATIVE mg/dL
HGB URINE DIPSTICK: NEGATIVE
KETONES UR: NEGATIVE mg/dL
Leukocytes, UA: NEGATIVE
Nitrite: NEGATIVE
PROTEIN: NEGATIVE mg/dL
SPECIFIC GRAVITY, URINE: 1.013 (ref 1.005–1.030)
pH: 5.5 (ref 5.0–8.0)

## 2016-02-26 LAB — MRSA PCR SCREENING: MRSA by PCR: NEGATIVE

## 2016-02-26 LAB — CBC
HCT: 51 % (ref 39.0–52.0)
Hemoglobin: 16.9 g/dL (ref 13.0–17.0)
MCH: 29 pg (ref 26.0–34.0)
MCHC: 33.1 g/dL (ref 30.0–36.0)
MCV: 87.5 fL (ref 78.0–100.0)
PLATELETS: 200 10*3/uL (ref 150–400)
RBC: 5.83 MIL/uL — ABNORMAL HIGH (ref 4.22–5.81)
RDW: 13.8 % (ref 11.5–15.5)
WBC: 17.4 10*3/uL — AB (ref 4.0–10.5)

## 2016-02-26 LAB — I-STAT TROPONIN, ED: Troponin i, poc: 0.03 ng/mL (ref 0.00–0.08)

## 2016-02-26 LAB — MAGNESIUM: Magnesium: 1.3 mg/dL — ABNORMAL LOW (ref 1.7–2.4)

## 2016-02-26 LAB — TROPONIN I
TROPONIN I: 0.16 ng/mL — AB (ref <0.03)
Troponin I: 0.23 ng/mL (ref <0.03)

## 2016-02-26 LAB — GLUCOSE, CAPILLARY: GLUCOSE-CAPILLARY: 105 mg/dL — AB (ref 65–99)

## 2016-02-26 LAB — HEPARIN LEVEL (UNFRACTIONATED): HEPARIN UNFRACTIONATED: 0.73 [IU]/mL — AB (ref 0.30–0.70)

## 2016-02-26 LAB — TSH: TSH: 0.468 u[IU]/mL (ref 0.350–4.500)

## 2016-02-26 MED ORDER — DILTIAZEM HCL ER 60 MG PO CP12
120.0000 mg | ORAL_CAPSULE | Freq: Two times a day (BID) | ORAL | Status: DC
  Administered 2016-02-26 (×2): 120 mg via ORAL
  Filled 2016-02-26 (×3): qty 2

## 2016-02-26 MED ORDER — ADULT MULTIVITAMIN W/MINERALS CH
1.0000 | ORAL_TABLET | Freq: Every day | ORAL | Status: DC
  Administered 2016-02-26 – 2016-02-27 (×2): 1 via ORAL
  Filled 2016-02-26 (×2): qty 1

## 2016-02-26 MED ORDER — FOLIC ACID 1 MG PO TABS
1.0000 mg | ORAL_TABLET | Freq: Every day | ORAL | Status: DC
  Administered 2016-02-26 – 2016-02-27 (×2): 1 mg via ORAL
  Filled 2016-02-26 (×2): qty 1

## 2016-02-26 MED ORDER — HEPARIN (PORCINE) IN NACL 100-0.45 UNIT/ML-% IJ SOLN
1400.0000 [IU]/h | INTRAMUSCULAR | Status: DC
  Administered 2016-02-26: 1500 [IU]/h via INTRAVENOUS
  Administered 2016-02-26: 1400 [IU]/h via INTRAVENOUS
  Filled 2016-02-26 (×3): qty 250

## 2016-02-26 MED ORDER — ACETAMINOPHEN 325 MG PO TABS
650.0000 mg | ORAL_TABLET | Freq: Four times a day (QID) | ORAL | Status: DC | PRN
  Administered 2016-02-26: 650 mg via ORAL
  Filled 2016-02-26: qty 2

## 2016-02-26 MED ORDER — ASPIRIN 81 MG PO CHEW
81.0000 mg | CHEWABLE_TABLET | Freq: Every day | ORAL | Status: DC
  Administered 2016-02-26 – 2016-02-27 (×2): 81 mg via ORAL
  Filled 2016-02-26 (×2): qty 1

## 2016-02-26 MED ORDER — THERAPEUTIC MULTIVIT/MINERAL PO TABS: 1.0000 | ORAL_TABLET | Freq: Every day | ORAL | Status: DC

## 2016-02-26 MED ORDER — MAGNESIUM SULFATE 2 GM/50ML IV SOLN
2.0000 g | Freq: Once | INTRAVENOUS | Status: AC
  Administered 2016-02-26: 2 g via INTRAVENOUS
  Filled 2016-02-26: qty 50

## 2016-02-26 MED ORDER — ONDANSETRON HCL 4 MG/2ML IJ SOLN: 4.0000 mg | Freq: Four times a day (QID) | INTRAMUSCULAR | Status: DC | PRN

## 2016-02-26 MED ORDER — LORAZEPAM 1 MG PO TABS: 1.0000 mg | ORAL_TABLET | Freq: Four times a day (QID) | ORAL | Status: DC | PRN

## 2016-02-26 MED ORDER — ONDANSETRON HCL 4 MG PO TABS: 4.0000 mg | ORAL_TABLET | Freq: Four times a day (QID) | ORAL | Status: DC | PRN

## 2016-02-26 MED ORDER — DILTIAZEM HCL 25 MG/5ML IV SOLN: 10.0000 mg | Freq: Once | INTRAVENOUS | Status: DC

## 2016-02-26 MED ORDER — ACETAMINOPHEN 650 MG RE SUPP: 650.0000 mg | Freq: Four times a day (QID) | RECTAL | Status: DC | PRN

## 2016-02-26 MED ORDER — ACETAMINOPHEN 500 MG PO TABS
1000.0000 mg | ORAL_TABLET | Freq: Once | ORAL | Status: AC
  Administered 2016-02-26: 1000 mg via ORAL
  Filled 2016-02-26: qty 2

## 2016-02-26 MED ORDER — OFF THE BEAT BOOK
Freq: Once | Status: AC
  Administered 2016-02-26: 07:00:00
  Filled 2016-02-26: qty 1

## 2016-02-26 MED ORDER — LORAZEPAM 2 MG/ML IJ SOLN
0.0000 mg | Freq: Two times a day (BID) | INTRAMUSCULAR | Status: DC
Start: 2016-02-28 — End: 2016-02-27

## 2016-02-26 MED ORDER — LORAZEPAM 2 MG/ML IJ SOLN: 1.0000 mg | Freq: Four times a day (QID) | INTRAMUSCULAR | Status: DC | PRN

## 2016-02-26 MED ORDER — DILTIAZEM LOAD VIA INFUSION
10.0000 mg | Freq: Once | INTRAVENOUS | Status: AC
  Administered 2016-02-26: 10 mg via INTRAVENOUS
  Filled 2016-02-26: qty 10

## 2016-02-26 MED ORDER — HEPARIN BOLUS VIA INFUSION
4000.0000 [IU] | Freq: Once | INTRAVENOUS | Status: AC
  Administered 2016-02-26: 4000 [IU] via INTRAVENOUS
  Filled 2016-02-26: qty 4000

## 2016-02-26 MED ORDER — ADENOSINE 6 MG/2ML IV SOLN
6.0000 mg | Freq: Once | INTRAVENOUS | Status: AC
  Administered 2016-02-26: 12 mg via INTRAVENOUS
  Administered 2016-02-26: 6 mg via INTRAVENOUS
  Administered 2016-02-26: 12 mg via INTRAVENOUS

## 2016-02-26 MED ORDER — THIAMINE HCL 100 MG/ML IJ SOLN: 100.0000 mg | Freq: Every day | INTRAMUSCULAR | Status: DC

## 2016-02-26 MED ORDER — ADENOSINE 6 MG/2ML IV SOLN
INTRAVENOUS | Status: AC
  Filled 2016-02-26: qty 4

## 2016-02-26 MED ORDER — LISINOPRIL 40 MG PO TABS
40.0000 mg | ORAL_TABLET | Freq: Every day | ORAL | Status: DC
  Administered 2016-02-26: 40 mg via ORAL
  Filled 2016-02-26: qty 1

## 2016-02-26 MED ORDER — VITAMIN B-1 100 MG PO TABS
100.0000 mg | ORAL_TABLET | Freq: Every day | ORAL | Status: DC
  Administered 2016-02-26 – 2016-02-27 (×2): 100 mg via ORAL
  Filled 2016-02-26 (×2): qty 1

## 2016-02-26 MED ORDER — PNEUMOCOCCAL VAC POLYVALENT 25 MCG/0.5ML IJ INJ
0.5000 mL | INJECTION | INTRAMUSCULAR | Status: AC
  Administered 2016-02-27: 0.5 mL via INTRAMUSCULAR
  Filled 2016-02-26: qty 0.5

## 2016-02-26 MED ORDER — ADENOSINE 6 MG/2ML IV SOLN
INTRAVENOUS | Status: AC
  Administered 2016-02-26: 12 mg via INTRAVENOUS
  Filled 2016-02-26: qty 2

## 2016-02-26 MED ORDER — ADENOSINE 6 MG/2ML IV SOLN
INTRAVENOUS | Status: AC
  Administered 2016-02-26: 12 mg via INTRAVENOUS
  Filled 2016-02-26: qty 4

## 2016-02-26 MED ORDER — DILTIAZEM HCL-DEXTROSE 100-5 MG/100ML-% IV SOLN (PREMIX)
10.0000 mg/h | INTRAVENOUS | Status: DC
  Administered 2016-02-26 (×2): 10 mg/h via INTRAVENOUS
  Filled 2016-02-26 (×2): qty 100

## 2016-02-26 MED ORDER — LORAZEPAM 2 MG/ML IJ SOLN
0.0000 mg | Freq: Four times a day (QID) | INTRAMUSCULAR | Status: DC
Start: 2016-02-26 — End: 2016-02-27

## 2016-02-26 NOTE — Consult Note (Signed)
Cardiology Consult    Patient ID: Allen Porter MRN: [id number], DOB/AGE: [birth year]   Admit date: 02/26/2016 Date of Consult: 02/26/2016  Primary Physician: No PCP Per Patient Reason for Consult: Atrial Fibrillation Primary Cardiologist: New - From [place] Requesting Provider: Dr. Sunnie Nielsen   History of Present Illness    Allen Porter is a 55 y.o. male with past medical history of polysubstance use (Cocaine, Alcohol, Marijuana, and tobacco), HTN, PAF, and pSVT who presented to [facility] ED on 02/26/2016 for palpitations.  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 Since then, he has indulged in alcohol and drug use. He reports drinking a 12-pack a day for the past several days and using Cocaine last night.  Around midnight, he awoke with palpitations and dyspnea, saying this felt similar to his previous episodes of pSVT. He had been instructed to attempt vagal maneuvers in the past and attempted this multiple times within a 30 minute period with no improvement of his symptoms.   Upon arriving to the ED, his HR was found to be elevated into the 180's. Initial EKG was interpreted as SVT and he was given 6mg  Adenosine without improvement. Given an additional 12mg  Adenosine and had return to NSR for less than 10 minutes then went back into SVT. He was given another 12mg  Adenosine with conversion to atrial fibrillation with RVR. He was started on a Cardizem drip along with Heparin and admitted for further evaluation.   He reports being on Coreg 12.5mg  BID, Lisinopril 40mg  daily, and 81mg  ASA prior to admission. Says he had not taken these for the past 3 days due to his drinking binge.  While admitted, labs have shown a WBC of 17.4, Hgb 16.9, and platelets 200. K+ 4.0. Creatinine 1.23. Cyclic troponin values are being obtained with initial value elevated at 0.16. TSH 0.468. Mg 1.3. UDS positive for cocaine. CXR  with no active disease. Most recent EKG shows atrial fibrillation with RVR, HR 126, with LVH and down-sloping ST in inferolateral leads likely consistent with repolarization abnormality  In reviewing Care Everywhere, he was hospitalized at [facility] in 06/2015 for atrial fibrillation with RVR. Started on Cardizem drip and converted to NSR during the admission. He was placed on ASA 81mg  daily due to his low CHA2DS2-VASc score. He had a nuclear stress test that admission which showed no evidence of ischemia or prior infarction. EF was estimated at 46%.  He denies any known repeat episodes of atrial fibrillation since. No known history of CAD. Does report smoking 1-2 packs of cigarettes per day and consuming a 12-pack of beer daily for the past several days. Says he uses Cocaine 3-4 times per year and Marijuana more regularly.    Past Medical History   Past Medical History:  Diagnosis Date  . Hypertension   . PAF (paroxysmal atrial fibrillation) (HCC)    a. initially occurring in 06/2015    Past Surgical History:  Procedure Laterality Date  . APPENDECTOMY       Allergies Allergies  Allergen Reactions  . Hctz [Hydrochlorothiazide] Shortness Of Breath and Palpitations    Inpatient Medications    . aspirin  81 mg Oral Daily  . diltiazem  120 mg Oral Q12H  . folic acid  1 mg Oral Daily  . LORazepam  0-4 mg Intravenous Q6H   Followed by  . [START ON 02/28/2016] LORazepam  0-4 mg Intravenous Q12H  .  multivitamin with minerals  1 tablet Oral Daily  . [START ON 02/27/2016] pneumococcal 23 valent vaccine  0.5 mL Intramuscular Tomorrow-1000  . thiamine  100 mg Oral Daily   Or  . thiamine  100 mg Intravenous Daily    Family History    Family History  Problem Relation Age of Onset  . Asthma Mother   . Diabetes Mellitus II Father     Social History    Social History   Social History  . Marital status: Single    Spouse name: N/A  . Number of children: N/A  . Years of  education: N/A   Occupational History  . Not on file.   Social History Main Topics  . Smoking status: Current Every Day Smoker    Packs/day: 1.00    Years: 30.00  . Smokeless tobacco: Never Used  . Alcohol use Yes     Comment: 12 bottles of beer daily.  . Drug use:     Types: Cocaine, Marijuana  . Sexual activity: Not on file   Other Topics Concern  . Not on file   Social History Narrative  . No narrative on file     Review of Systems    General:  No chills, fever, night sweats or weight changes.  Cardiovascular:  No chest pain, edema, orthopnea, paroxysmal nocturnal dyspnea. Positive for palpitations and dyspnea.  Dermatological: No rash, lesions/masses Respiratory: No cough, Positive for dyspnea Urologic: No hematuria, dysuria Abdominal:   No nausea, vomiting, diarrhea, bright red blood per rectum, melena, or hematemesis Neurologic:  No visual changes, wkns, changes in mental status. All other systems reviewed and are otherwise negative except as noted above.  Physical Exam    Blood pressure 116/75, pulse 85, temperature 98.4 F (36.9 C), temperature source Oral, resp. rate 16, height 6' (1.829 m), weight 205 lb 3.2 oz (93.1 kg), SpO2 98 %.  General: Pleasant, Caucasian male appearing in NAD Psych: Normal affect. Neuro: Alert and oriented X 3. Moves all extremities spontaneously. HEENT: Normal  Neck: Supple without bruits or JVD. Lungs:  Resp regular and unlabored, CTA without wheezing or rales. Heart: Irregularly irregular, no s3, s4, or murmurs. Abdomen: Soft, non-tender, non-distended, BS + x 4.  Extremities: No clubbing, cyanosis or edema. DP/PT/Radials 2+ and equal bilaterally.  Labs    Troponin [facility] of Care Test)  Recent Labs  02/26/16 0151  TROPIPOC 0.03    Recent Labs  02/26/16 0602 02/26/16 1224  TROPONINI 0.16* 0.23*   Lab Results  Component Value Date   WBC 12.5 (H) 02/26/2016   HGB 14.1 02/26/2016   HCT 43.2 02/26/2016   MCV 87.8  02/26/2016   PLT 180 02/26/2016     Recent Labs Lab 02/26/16 0602  NA 138  K 3.8  CL 105  CO2 24  BUN 14  CREATININE 1.00  CALCIUM 8.9  PROT 5.8*  BILITOT 0.8  ALKPHOS 69  ALT 17  AST 18  GLUCOSE 108*   No results found for: CHOL, HDL, LDLCALC, TRIG No results found for: [facility]   Radiology Studies    Dg Chest Port 1 View  Result Date: 02/26/2016 CLINICAL DATA:  Chest pain and dyspnea, onset tonight EXAM: PORTABLE CHEST 1 VIEW COMPARISON:  None. FINDINGS: A single AP portable view of the chest demonstrates no focal airspace consolidation or alveolar edema. The lungs are grossly clear. There is no large effusion or pneumothorax. Cardiac and mediastinal contours appear unremarkable. IMPRESSION: No active disease. Electronically Signed  By: Ellery Plunkaniel R Mitchell M.D.   On: 02/26/2016 04:00    EKG & Cardiac Imaging    EKG: Atrial fibrillation with RVR, HR 126, with LVH and down-sloping ST in inferolateral leads likely consistent with repolarization abnormality  Echocardiogram: 06/2015 FINDINGS:  Left Ventricle: Mild concentric left ventricular hypertrophy. Normal left ventricular cavity size.  Left Ventricle Normal global left ventricular systolic function. Mild diastolic dysfunction.  Function:  LVEF: 65 %   Left Atrium: Normal left atrial size. Left atrial volume index 6216mLm2.  Right Ventricle: Normal right ventricular size. Normal right ventricular global systolic function.  Right Atrium: Normal right atrial size.  Mitral Valve: Structurally/functionally normal mitral valve. No regurgitation.  Aortic Valve: Structurally normal aortic valve. No regurgitation.  Tricuspid Valve: Normally structured tricuspid valve. No regurgitation.  Pulmonic Valve: Structurally normal pulmonic valve.  Aorta: Normal aortic root diameter.  Pericardium: No pericardial effusion.  Masses / Shunts: No masses, shunts or thrombi seen.  Nuclear Stress Test: 06/2015 FINDINGS: Stress and rest  images reveal uniform radioisotope uptake with no consistent photopenia.  The gated SPECT images of Cardiolite study reveal normal left ventricular wall motion.  Low normal left ventricular systolic function with a gated calculated ejection fraction of 46%.  COMBINED INTERPRETATION: 1. No evidence for reversible myocardial ischemia or infarction. 2. Normal left ventricular wall motion, low normal systolic function with gated SPECT ejection fraction of 46%   Assessment & Plan    1. Atrial Fibrillation with RVR - had a history of this in 06/2015 and converted to NSR on IV Cardizem. Consumed an excessive amount of alcohol yesterday and used Cocaine, developing palpitations and presenting in SVT. Following conversion with Adenosine, he went into atrial fibrillation with RVR.  -  TSH 0.468. Mg 1.3 (being replaced - recheck in AM). UDS positive for cocaine. Most recent EKG shows atrial fibrillation with RVR, HR 126, LVH and down-sloping ST in inferolateral leads likely consistent with repolarization abnormality. - started on IV Cardizem and being switched to PO. HR currently in the 50's - 60's. Will stop IV Cardizem. Transition from Cardizem SR 120mg  BID to Cardizem CD tomorrow for improved compliance with once daily dosing. Avoid BB with recent Cocaine use.  - This patients CHA2DS2-VASc Score and unadjusted Ischemic Stroke Rate (% per year) is equal to 0.6 % stroke rate/year from a score of 1 (HTN). With this score, he does not require full-dose anticoagulation. Even if so, this would not be ideal in the setting of his alcohol abuse and noncompliance. Can continue 81mg  ASA.   2. Elevated Troponin - troponin values have been 0.16 and 0.23 thus far, likely secondary to demand ischemia in the setting of pSVT and atrial fibrillation with RVR - denies any recent anginal symptoms. 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  - recent NST in 06/2015 showed no  evidence of ischemia or prior infarction. EF was estimated at 46% but echo at that time showed preserved EF of 65%. With recent normal NST and lack of anginal symptoms, would not pursue further ischemic evaluation at this time.   3. HTN - BP well-controlled. - On Coreg 12.5mg  BID and Lisinopril 40mg  daily PTA. - would avoid resuming BB in setting of recent Cocaine use.   4. Polysubstance Abuse - reports consuming a 12 pack of beer daily, using Marijuana regularly, smoking 1-2 ppd, and using Cocaine 4 times per year. - patient says he is trying to cut back on use of  these substances and was strongly encouraged to do so.     Signed, Ellsworth Lennox, PA-C 02/26/2016, 2:00 PM Pager: [phone] Patient seen and examined and history reviewed. Agree with above findings and plan. 55 yo WM with history of SVT and paroxysmal AFib. Cardiac work up documented as above. Recent Etoh binge and cocaine abuse. Admitted with SVT initially then converted to afib. Rate currently controlled. Off IV diltiazem. [place] vasc score of 1 so risk of CVA is low. I would not recommend anticoagulation due to increased risk of bleeding with Etoh abuse. Patient has little insight into his illness and how his lifestyle is affecting his health. Recommend abstinence from Etoh and cocaine. Beta blockers contraindicated with cocaine use. Would DC on oral cardizem. No further cardiac work up needed this admission. Troponin elevation is due to demand ischemia from SVT and cocaine. Prior stress test normal.  Salam Chesterfield [place], MDFACC 02/26/2016 2:04 PM

## 2016-02-26 NOTE — ED Provider Notes (Signed)
[facility]-EMERGENCY DEPT Provider Note   CSN: [id number] Arrival date & time: 02/26/16  0130  By signing my name below, I, Allen Porter, attest that this documentation has been prepared under the direction and in the presence of Allen Crease, MD.  Electronically Signed: Octavia Porter, ED Scribe. 02/26/16. 1:47 AM.    History   Chief Complaint Chief Complaint  Patient presents with  . Tachycardia    The history is provided by the patient. No language interpreter was used.   HPI Comments: Allen Porter is a 55 y.o. male who has a Pmhx of SVT and HTN presents to the Emergency Department complaining of sudden onset, gradual worsening, moderate "fast heart beat" onset this evening secondary to using cocaine. He also notes associated shortness of breath and feeling mild left sided pain that occurred around the same time as his tachycardia. Pt has a hx of SVT and notes this feels similar. Pt says he smoked some cocaine these evening and notes he believed that sparked his current symptoms. Pt is currently on lisinopril, carvedilol, and 324 mg of ASA after using the cocaine. He denies any other complaints.  Past Medical History:  Diagnosis Date  . Hypertension     There are no active problems to display for this patient.   Past Surgical History:  Procedure Laterality Date  . APPENDECTOMY         Home Medications    Prior to Admission medications   Medication Sig Start Date End Date Taking? Authorizing Provider  aspirin 81 MG chewable tablet Chew 81 mg by mouth daily.    Historical Provider, MD  carvedilol (COREG) 12.5 MG tablet Take 12.5 mg by mouth 2 (two) times daily. 12/24/14   Historical Provider, MD  indomethacin (INDOCIN) 25 MG capsule Take 1 capsule (25 mg total) by mouth 3 (three) times daily as needed. 01/26/16   [name], MD  lisinopril (PRINIVIL,ZESTRIL) 40 MG tablet Take 40 mg by mouth daily. 12/24/14   Historical Provider, MD  therapeutic multivitamin-minerals  (THERAGRAN-M) tablet Take 1 tablet by mouth daily.    Historical Provider, MD    Family History No family history on file.  Social History Social History  Substance Use Topics  . Smoking status: Current Every Day Smoker  . Smokeless tobacco: Never Used  . Alcohol use Yes     Allergies   Hctz [hydrochlorothiazide]   Review of Systems Review of Systems  Respiratory: Positive for shortness of breath.   Musculoskeletal: Positive for arthralgias.  All other systems reviewed and are negative.    Physical Exam Updated Vital Signs BP 104/81   Pulse 113   Temp 97.9 F (36.6 C)   Resp 16   SpO2 97%   Physical Exam  Constitutional: He is oriented to person, place, and time. He appears well-developed and well-nourished. No distress.  HENT:  Head: Normocephalic and atraumatic.  Right Ear: Hearing normal.  Left Ear: Hearing normal.  Nose: Nose normal.  Mouth/Throat: Oropharynx is clear and moist and mucous membranes are normal.  Eyes: Conjunctivae and EOM are normal. Pupils are equal, round, and reactive to light.  Neck: Normal range of motion. Neck supple.  Cardiovascular: S1 normal and S2 normal.  Tachycardia present.  Exam reveals no gallop and no friction rub.   No murmur heard. Pulmonary/Chest: Effort normal and breath sounds normal. No respiratory distress. He exhibits no tenderness.  Abdominal: Soft. Normal appearance and bowel sounds are normal. There is no hepatosplenomegaly. There is no tenderness. There  is no rebound, no guarding, no tenderness at McBurney's point and negative Murphy's sign. No hernia.  Musculoskeletal: Normal range of motion.  Neurological: He is alert and oriented to person, place, and time. He has normal strength. No cranial nerve deficit or sensory deficit. Coordination normal. GCS eye subscore is 4. GCS verbal subscore is 5. GCS motor subscore is 6.  Skin: Skin is warm, dry and intact. No rash noted. No cyanosis.  Psychiatric: He has a normal  mood and affect. His speech is normal and behavior is normal. Thought content normal.  Nursing note and vitals reviewed.    ED Treatments / Results  DIAGNOSTIC STUDIES: Oxygen Saturation is 98% on RA, normal by my interpretation.  COORDINATION OF CARE:  1:44 AM Discussed treatment plan with pt at bedside and pt agreed to plan.  Labs (all labs ordered are listed, but only abnormal results are displayed) Labs Reviewed  BASIC METABOLIC PANEL - Abnormal; Notable for the following:       Result Value   CO2 20 (*)    Glucose, Bld 133 (*)    All other components within normal limits  CBC - Abnormal; Notable for the following:    WBC 17.4 (*)    RBC 5.83 (*)    All other components within normal limits  I-STAT TROPOININ, ED    EKG  EKG Interpretation (presentation)  Date/Time:  Thursday February 26 2016 01:33:27 EDT Ventricular Rate:  188 PR Interval:    QRS Duration: 82 QT Interval:  270 QTC Calculation: 477 R Axis:   6 Text Interpretation:  Supraventricular tachycardia Marked ST abnormality, possible inferior subendocardial injury Abnormal ECG Confirmed by POLLINA  MD, CHRISTOPHER [phone]) on 02/26/2016 2:39:25 AM       EKG Interpretation (after adenosine)  Date/Time:  Thursday February 26 2016 01:50:03 EDT Ventricular Rate:  109 PR Interval:    QRS Duration: 87 QT Interval:  338 QTC Calculation: 456 R Axis:   56 Text Interpretation:  Sinus tachycardia Atrial premature complexes Probable left atrial enlargement Borderline ST depression, diffuse leads Confirmed by Allen Leatherwood  MD, CHRISTOPHER [phone]) on 02/26/2016 2:42:20 AM       EKG Interpretation (recurrence of SVT)  Date/Time:  Thursday February 26 2016 02:13:24 EDT Ventricular Rate:  173 PR Interval:    QRS Duration: 82 QT Interval:  304 QTC Calculation: 516 R Axis:   2 Text Interpretation:  Supraventricular tachycardia ST depression prob rate related Confirmed by POLLINA  MD, CHRISTOPHER [phone]) on 02/26/2016 2:43:35  AM       EKG Interpretation (after adenosine)  Date/Time:  Thursday February 26 2016 02:13:24 EDT Ventricular Rate:  126 PR Interval:    QRS Duration: 89 QT Interval:  363 QTC Calculation: 526 R Axis:   42 Text Interpretation:  Atrial fibrillation Repol abnrm suggests ischemia, diffuse leads Prolonged QT interval Confirmed by Allen Leatherwood  MD, CHRISTOPHER ([id number]) on 02/26/2016 2:43:35 AM       Radiology No results found.  Procedures .Cardioversion Date/Time: 02/26/2016 1:45 AM Performed by: Allen Porter Authorized by: Allen Porter   Consent:    Consent obtained:  Verbal   Consent given by:  Patient Pre-procedure details:    Cardioversion basis:  Emergent   Rhythm:  Supraventricular tachycardia Attempt one:    Cardioversion mode attempt one: adenosine 54m IVP.   Shock outcome:  No change in rhythm Attempt two:    Cardioversion mode attempt two: adenosine 12mg  IVP.   Shock outcome:  Conversion to normal sinus  rhythm Post-procedure details:    Patient tolerance of procedure:  Tolerated well, no immediate complications  .Cardioversion Date/Time: 02/26/2016 1:52 AM Performed by: Allen CreasePOLLINA, CHRISTOPHER J Authorized by: Allen CreasePOLLINA, CHRISTOPHER J   Consent:    Consent obtained:  Verbal   Consent given by:  Patient Pre-procedure details:    Cardioversion basis:  Emergent   Rhythm:  Supraventricular tachycardia Attempt one:    Cardioversion mode attempt one: adenosine 12mg  IVP.   Shock outcome:  Conversion to other rhythm (AFIB w/ RVR) Post-procedure details:    Patient tolerance of procedure:  Tolerated well, no immediate complications   (including critical care time)  Medications Ordered in ED Medications  adenosine (ADENOCARD) 6 MG/2ML injection (  Not Given 02/26/16 0152)  diltiazem (CARDIZEM) 1 mg/mL load via infusion 10 mg (10 mg Intravenous Bolus from Bag 02/26/16 0225)    And  diltiazem (CARDIZEM) 100 mg in dextrose 5% 100mL (1 mg/mL) infusion (15  mg/hr Intravenous Rate/Dose Change 02/26/16 0307)  adenosine (ADENOCARD) 6 MG/2ML injection 6 mg (12 mg Intravenous Given 02/26/16 0222)  acetaminophen (TYLENOL) tablet 1,000 mg (1,000 mg Oral Given 02/26/16 0316)   CRITICAL CARE Performed by: Allen CreasePOLLINA, CHRISTOPHER J.   Total critical care time: 35 minutes  Critical care time was exclusive of separately billable procedures and treating other patients.  Critical care was necessary to treat or prevent imminent or life-threatening deterioration.  Critical care was time spent personally by me on the following activities: development of treatment plan with patient and/or surrogate as well as nursing, discussions with consultants, evaluation of patient's response to treatment, examination of patient, obtaining history from patient or surrogate, ordering and performing treatments and interventions, ordering and review of laboratory studies, ordering and review of radiographic studies, pulse oximetry and re-evaluation of patient's condition.   Initial Impression / Assessment and Plan / ED Course  I have reviewed the triage vital signs and the nursing notes.  Pertinent labs & imaging results that were available during my care of the patient were reviewed by me and considered in my medical decision making (see chart for details).  Clinical Course   Patient presents to the emergency department for evaluation of chest pain and rapid heartbeat. Patient reports that he smoked crack cocaine approximately an hour before onset of symptoms. Patient was noted to have a heart rate in the 180s in triage was brought straight back to the ER. Patient does indicate that he does have a history of supraventricular tachycardia approximately 10 years ago. EKG was obtained and did support diagnosis of PSVT. Patient administered adenosine 6 mg without improvement. He was redosed at 12 mg and broke into a sinus rhythm. This lasted 5-10 minutes and then he went back into SVT.  Patient was redosed with adenosine 12 mg IV push at which time his SVT broke but he was noted to be in atrial fibrillation with rapid ventricular response. Further discussion with the patient reveals that he has had this before as well. Reviewing care everywhere reveals that he had a [facility]. Reviewing this record reveals that he did have atrial fibrillation with rapid ventricular response treated with Cardizem drip. He tells me that they did discuss the possibility of electrical cardioversion, but he converted on Cardizem prior to requiring this. He was discharged on Coreg, lisinopril and aspirin therapy.  I did consult cardiology by phone initially. Specifically I was concerned about the possibility of exacerbating his symptoms with using Cardizem after cocaine use. Dr. Orson AloeHenderson felt  that Cardizem would be without risk. He was therefore initiated on IV Cardizem by drip to treat his atrial fibrillation. Patient will require hospitalization for further management.  CHA2DS2-VASc Score for Atrial Fibrillation Stroke Risk from [email or web address]  on 02/26/2016 RESULT SUMMARY: 1 points Stroke risk was 0.6% per year in >90,000 patients (the [place] Atrial Fibrillation Cohort Study) and 0.9% risk of stroke/TIA/systemic embolism.  One recommendation suggests a 0 score is "low" risk and may not require anticoagulation; a 1 score is "low-moderate" risk and should consider antiplatelet or anticoagulation, and score 2 or greater is "moderate-high" risk and should otherwise be an anticoagulation candidate.  INPUTS: Age -> 0 = <65 Sex -> 0 = Male <abbr title='Congestive heart failure'>CHF</abbr> history -> 0 = No Hypertension history -> 1 = Yes Stroke/TIA/Thromboembolism history -> 0 = No Vascular disease history -> 0 = No Diabetes history -> 0 = No   Final Clinical Impressions(s) / ED Diagnoses   Final diagnoses:  SVT (supraventricular tachycardia) (HCC)  Atrial fibrillation with RVR  (HCC)  Cocaine abuse  Chest pain, unspecified chest pain type   I personally performed the services described in this documentation, which was scribed in my presence. The recorded information has been reviewed and is accurate.   New Prescriptions New Prescriptions   No medications on file     Allen Crease, MD 02/26/16 [phone]

## 2016-02-26 NOTE — H&P (Signed)
History and Physical    Allen Porter GEX:[id number] DOB: 01-31-1961 DOA: 02/26/2016  PCP: No PCP Per Patient  Patient coming from: Home.  Note that patient is visiting [place] for work purposes and usually lives in [place].  Chief Complaint: Palpitations.  HPI: Allen Porter is a 55 y.o. male with hypertension and history of paroxysmal atrial fibrillation and previous history of SVT and polysubstance abuse presents to the ER with complaints of palpitations. Patient states last night around 10 PM patient had cocaine and alcohol and 2 hours later started developing palpitations. Denies any chest pain or shortness of breath. In the ER patient was initially found to be in SVT and was given adenosine and patient cardioverted but went back into SVT and patient was given a second dose of Adenosine following which patient converted to A. fib with RVR. Patient was started on Cardizem infusion. Patient states he has not taken his medications last 3 days. Patient is usually on lisinopril and Coreg. Patient also was recently diagnosed with gout. At this time patient is being admitted for further management of A. fib with RVR.   ED Course: Patient was initially given adenosine twice for SVT. Later patient converted to atrial fibrillation following which patient was started on Cardizem infusion and heparin infusion.  Review of Systems: As per HPI, rest all negative.   Past Medical History:  Diagnosis Date  . Hypertension     Past Surgical History:  Procedure Laterality Date  . APPENDECTOMY       reports that he has been smoking.  He has never used smokeless tobacco. He reports that he drinks alcohol. He reports that he uses drugs, including Cocaine.  Allergies  Allergen Reactions  . Hctz [Hydrochlorothiazide] Shortness Of Breath and Palpitations    Family History  Problem Relation Age of Onset  . Asthma Mother   . Diabetes Mellitus II Father     Prior to Admission medications    Medication Sig Start Date End Date Taking? Authorizing Provider  aspirin 81 MG chewable tablet Chew 81 mg by mouth daily.   Yes Historical Provider, MD  carvedilol (COREG) 12.5 MG tablet Take 12.5 mg by mouth 2 (two) times daily. 12/24/14  Yes Historical Provider, MD  indomethacin (INDOCIN) 25 MG capsule Take 1 capsule (25 mg total) by mouth 3 (three) times daily as needed. Patient taking differently: Take 25 mg by mouth 3 (three) times daily as needed for mild pain.  01/26/16  Yes [name], MD  lisinopril (PRINIVIL,ZESTRIL) 40 MG tablet Take 40 mg by mouth daily. 12/24/14  Yes Historical Provider, MD  therapeutic multivitamin-minerals (THERAGRAN-M) tablet Take 1 tablet by mouth daily.   Yes Historical Provider, MD    Physical Exam: Vitals:   02/26/16 0430 02/26/16 0445 02/26/16 0500 02/26/16 0515  BP: 123/88 135/94 122/98 140/89  Pulse:  95 104 115  Resp: 20 19 19 17   Temp:      SpO2:  98% 96% 98%      Constitutional: Not in distress. Vitals:   02/26/16 0430 02/26/16 0445 02/26/16 0500 02/26/16 0515  BP: 123/88 135/94 122/98 140/89  Pulse:  95 104 115  Resp: 20 19 19 17   Temp:      SpO2:  98% 96% 98%   Eyes: Anicteric no pallor. ENMT: No discharge from the ears eyes nose or mouth. Neck: No mass felt. No JVD appreciated. Respiratory: No rhonchi or crepitations. Cardiovascular: S1 and S2 heard. Abdomen: Soft nontender bowel sounds present. No guarding  or rigidity. Musculoskeletal: No edema. Skin: No rash. Neurologic: Alert awake oriented to time place and person. Moves all extremities. Psychiatric: Appears normal.   Labs on Admission: I have personally reviewed following labs and imaging studies  CBC:  Recent Labs Lab 02/26/16 0130  WBC 17.4*  HGB 16.9  HCT 51.0  MCV 87.5  PLT 200   Basic Metabolic Panel:  Recent Labs Lab 02/26/16 0130  NA 136  K 4.0  CL 105  CO2 20*  GLUCOSE 133*  BUN 17  CREATININE 1.23  CALCIUM 9.7   GFR: CrCl cannot be  calculated (Unknown ideal weight.). Liver Function Tests: No results for input(s): AST, ALT, ALKPHOS, BILITOT, PROT, ALBUMIN in the last 168 hours. No results for input(s): LIPASE, AMYLASE in the last 168 hours. No results for input(s): AMMONIA in the last 168 hours. Coagulation Profile: No results for input(s): INR, PROTIME in the last 168 hours. Cardiac Enzymes: No results for input(s): CKTOTAL, CKMB, CKMBINDEX, TROPONINI in the last 168 hours. BNP (last 3 results) No results for input(s): PROBNP in the last 8760 hours. HbA1C: No results for input(s): HGBA1C in the last 72 hours. CBG: No results for input(s): GLUCAP in the last 168 hours. Lipid Profile: No results for input(s): CHOL, HDL, LDLCALC, TRIG, CHOLHDL, LDLDIRECT in the last 72 hours. Thyroid Function Tests: No results for input(s): TSH, T4TOTAL, FREET4, T3FREE, THYROIDAB in the last 72 hours. Anemia Panel: No results for input(s): VITAMINB12, FOLATE, FERRITIN, TIBC, IRON, RETICCTPCT in the last 72 hours. Urine analysis: No results found for: COLORURINE, APPEARANCEUR, LABSPEC, PHURINE, GLUCOSEU, HGBUR, BILIRUBINUR, KETONESUR, PROTEINUR, UROBILINOGEN, NITRITE, LEUKOCYTESUR Sepsis Labs: @LABRCNTIP (procalcitonin:4,lacticidven:4) )No results found for this or any previous visit (from the past 240 hour(s)).   Radiological Exams on Admission: Dg Chest Port 1 View  Result Date: 02/26/2016 CLINICAL DATA:  Chest pain and dyspnea, onset tonight EXAM: PORTABLE CHEST 1 VIEW COMPARISON:  None. FINDINGS: A single AP portable view of the chest demonstrates no focal airspace consolidation or alveolar edema. The lungs are grossly clear. There is no large effusion or pneumothorax. Cardiac and mediastinal contours appear unremarkable. IMPRESSION: No active disease. Electronically Signed   By: Ellery Plunkaniel R Mitchell M.D.   On: 02/26/2016 04:00    EKG: Independently reviewed. Initial EKG was showing SVT. Later on was showing A. fib with  RVR.  Assessment/Plan Principal Problem:   Atrial fibrillation with RVR (HCC) Active Problems:   Hypertension   Polysubstance abuse    1. A. fib with RVR with initial presentation of SVT - patient has known history of paroxysmal atrial fibrillation and SVT. Reviewing patient's chart in care everywhere patient had 2-D echo done January 2017 which shows EF of 65% with diastolic dysfunction and had a normal stress test. At this time patient's chads 2 vasc score will be 2 if hypertension and diastolic dysfunction is taken into account. Patient is on heparin infusion and Cardizem infusion. Since patient has taken cocaine will hold off Coreg. Check thyroid function tests troponin. Patient states last November he was admitted to the hospital after a similar picture when patients A. fib was precipitated by cocaine abuse. 2. Polysubstance abuse including cocaine and alcohol and tobacco - patient is being advised to quit all habits. Patient is placed on CIWA protocol. 3. Hypertension - continue lisinopril. Hold off Coreg due to cocaine abuse. 4. Leukocytosis probably reactionary. UA is pending. Chest x-ray unremarkable.   DVT prophylaxis: Heparin. Code Status: Full code.  Family Communication: Discussed with patient.  Disposition Plan:  Home.  Consults called: None.  Admission status: Observation. Stepdown.    Eduard Clos MD [facility] Pager [phone].  If 7PM-7AM, please contact night-coverage [email or web address] Password [facility]  02/26/2016, 5:41 AM

## 2016-02-26 NOTE — ED Notes (Signed)
Dr. Kakrakandy at bedside. 

## 2016-02-26 NOTE — ED Triage Notes (Signed)
Pt c/o chest pain with sob x 1 hour after cocaine use.

## 2016-02-26 NOTE — Progress Notes (Signed)
CRITICAL VALUE ALERT  Critical value received: trop 0.16  Date of notification:  02/26/2016  Time of notification:  0735  Critical value read back:Yes.    Nurse who received alert:  Charlane FerrettiKristen Rasheed Welty RN   MD notified (1st page):  Dr. Sunnie Nielsenegalado  Time of first page:  On Floor - made aware  MD notified (2nd page):  Time of second page:  Responding MD:    Time MD responded:  Dr. Sunnie Nielsenegalado

## 2016-02-26 NOTE — ED Triage Notes (Signed)
Pt took lisinopril, carvedilol, and 324 ASA pta

## 2016-02-26 NOTE — Progress Notes (Signed)
PROGRESS NOTE    Allen Porter Porter  ZOX:[id number] DOB: 1960-11-25 DOA: 02/26/2016 PCP: No PCP Per Patient   Brief Narrative: Allen Porter Porter is a 55 y.o. male with hypertension and history of paroxysmal atrial fibrillation and previous history of SVT and polysubstance abuse presents to the ER with complaints of palpitations. Patient states last night around 10 PM patient had cocaine and alcohol and 2 hours later started developing palpitations. Denies any chest pain or shortness of breath. In the ER patient was initially found to be in SVT and was given adenosine and patient cardioverted but went back into SVT and patient was given a second dose of Adenosine following which patient converted to A. fib with RVR. Patient was started on Cardizem infusion. Patient states he has not taken his medications last 3 days. Patient is usually on lisinopril and Coreg. Patient also was recently diagnosed with gout. At this time patient is being admitted for further management of A. fib with RVR.   1-A fib RVR; in setting of cocaine and alcohol use.  Had episodes of SVT in the ED, received 2 doses of adenosine.  HR controlled on Cardizem Gtt-- transition to oral.  On heparin Gtt.  Cardiology consulted to help with anticoagulation and A fib.   2-Alcohol use; Monitor on CIWA.   3-HTN; Hold Lisinopril , SBP in the 110 on Cardizem Gtt.   4-Leukocytosis; trending down. No need for antibiotics.   5-Mild elevation of troponin;  Follow trend. Suspect related to A fib, SVT.   6-Hypomagnesemia; IV suplemment ordered.   Assessment & Plan:   Principal Problem:   Atrial fibrillation with RVR (HCC) Active Problems:   Hypertension   Polysubstance abuse     DVT prophylaxis: on heparin  Code Status: full code.  Family Communication: care discussed with patient.  Disposition Plan: home when stable.    Consultants:   Cardiology    Procedures:   none   Antimicrobials:   none   Subjective: He is  feeling well, denies chest pain, dizziness.  He use cocaine rarely. He drinks alcohol , beers every other day 4 beers.    Objective: Vitals:   02/26/16 0515 02/26/16 0551 02/26/16 0620 02/26/16 0720  BP: 140/89 115/82 119/85 116/75  Pulse: 115 100 90 85  Resp: 17 18 16 16   Temp:   98.2 F (36.8 C) 98.4 F (36.9 C)  TempSrc:   Oral Oral  SpO2: 98% 98% 97% 98%  Weight:  93.1 kg (205 lb 3.2 oz)    Height:  6' (1.829 m)      Intake/Output Summary (Last 24 hours) at 02/26/16 [id number] Last data filed at 02/26/16 0800  Gross per 24 hour  Intake                0 ml  Output                0 ml  Net                0 ml   Filed Weights   02/26/16 0551  Weight: 93.1 kg (205 lb 3.2 oz)    Examination:  General exam: Appears calm and comfortable  Respiratory system: Clear to auscultation. Respiratory effort normal. Cardiovascular system: S1 & S2 heard, RRR. No JVD, murmurs, rubs, gallops or clicks. No pedal edema. Gastrointestinal system: Abdomen is nondistended, soft and nontender. No organomegaly or masses felt. Normal bowel sounds heard. Central nervous system: Alert and oriented. No focal neurological deficits. Extremities: Symmetric  5 x 5 power. Skin: No rashes, lesions or ulcers Psychiatry: Judgement and insight appear normal. Mood & affect appropriate.     Data Reviewed: I have personally reviewed following labs and imaging studies  CBC:  Recent Labs Lab 02/26/16 0130 02/26/16 0602  WBC 17.4* 12.5*  NEUTROABS  --  9.1*  HGB 16.9 14.1  HCT 51.0 43.2  MCV 87.5 87.8  PLT 200 180   Basic Metabolic Panel:  Recent Labs Lab 02/26/16 0130 02/26/16 0602  NA 136 138  K 4.0 3.8  CL 105 105  CO2 20* 24  GLUCOSE 133* 108*  BUN 17 14  CREATININE 1.23 1.00  CALCIUM 9.7 8.9  MG  --  1.3*   GFR: Estimated Creatinine Clearance: 90.5 mL/min (by C-G formula based on SCr of 1 mg/dL). Liver Function Tests:  Recent Labs Lab 02/26/16 0602  AST 18  ALT 17  ALKPHOS 69    BILITOT 0.8  PROT 5.8*  ALBUMIN 3.4*   No results for input(s): LIPASE, AMYLASE in the last 168 hours. No results for input(s): AMMONIA in the last 168 hours. Coagulation Profile: No results for input(s): INR, PROTIME in the last 168 hours. Cardiac Enzymes:  Recent Labs Lab 02/26/16 0602  TROPONINI 0.16*   BNP (last 3 results) No results for input(s): PROBNP in the last 8760 hours. HbA1C: No results for input(s): HGBA1C in the last 72 hours. CBG:  Recent Labs Lab 02/26/16 0723  GLUCAP 105*   Lipid Profile: No results for input(s): CHOL, HDL, LDLCALC, TRIG, CHOLHDL, LDLDIRECT in the last 72 hours. Thyroid Function Tests:  Recent Labs  02/26/16 0602  TSH 0.468   Anemia Panel: No results for input(s): VITAMINB12, FOLATE, FERRITIN, TIBC, IRON, RETICCTPCT in the last 72 hours. Sepsis Labs: No results for input(s): PROCALCITON, LATICACIDVEN in the last 168 hours.  Recent Results (from the past 240 hour(s))  MRSA PCR Screening     Status: None   Collection Time: 02/26/16  5:41 AM  Result Value Ref Range Status   MRSA by PCR NEGATIVE NEGATIVE Final    Comment:        The GeneXpert MRSA Assay (FDA approved for NASAL specimens only), is one component of a comprehensive MRSA colonization surveillance program. It is not intended to diagnose MRSA infection nor to guide or monitor treatment for MRSA infections.          Radiology Studies: Dg Chest Port 1 View  Result Date: 02/26/2016 CLINICAL DATA:  Chest pain and dyspnea, onset tonight EXAM: PORTABLE CHEST 1 VIEW COMPARISON:  None. FINDINGS: A single AP portable view of the chest demonstrates no focal airspace consolidation or alveolar edema. The lungs are grossly clear. There is no large effusion or pneumothorax. Cardiac and mediastinal contours appear unremarkable. IMPRESSION: No active disease. Electronically Signed   By: Ellery Plunk M.D.   On: 02/26/2016 04:00        Scheduled Meds: . adenosine       . aspirin  81 mg Oral Daily  . folic acid  1 mg Oral Daily  . LORazepam  0-4 mg Intravenous Q6H   Followed by  . [START ON 02/28/2016] LORazepam  0-4 mg Intravenous Q12H  . magnesium sulfate 1 - 4 g bolus IVPB  2 g Intravenous Once  . multivitamin with minerals  1 tablet Oral Daily  . [START ON 02/27/2016] pneumococcal 23 valent vaccine  0.5 mL Intramuscular Tomorrow-1000  . thiamine  100 mg Oral Daily   Or  .  thiamine  100 mg Intravenous Daily   Continuous Infusions: . diltiazem (CARDIZEM) infusion 10 mg/hr (02/26/16 0728)  . heparin 1,500 Units/hr (02/26/16 0546)     LOS: 0 days    Time spent: 35 minutes.     Alba Cory, MD [facility] Pager [phone]  If 7PM-7AM, please contact night-coverage [email or web address] Password [email or web address] 02/26/2016, 9:22 AM

## 2016-02-26 NOTE — ED Notes (Signed)
Repaged Cards --TY

## 2016-02-26 NOTE — Progress Notes (Signed)
ANTICOAGULATION CONSULT NOTE  Pharmacy Consult for Heparin Indication: atrial fibrillation  Allergies  Allergen Reactions  . Hctz [Hydrochlorothiazide] Shortness Of Breath and Palpitations    Patient Measurements: Height: 6' (182.9 cm) Weight: 205 lb 3.2 oz (93.1 kg) IBW/kg (Calculated) : 77.6 Heparin Dosing Weight: 95 kg  Vital Signs: Temp: 98.4 F (36.9 C) (08/31 0720) Temp Source: Oral (08/31 0720) BP: 116/75 (08/31 0720) Pulse Rate: 85 (08/31 0720)  Labs:  Recent Labs  02/26/16 0130 02/26/16 0602 02/26/16 1224  HGB 16.9 14.1  --   HCT 51.0 43.2  --   PLT 200 180  --   HEPARINUNFRC  --   --  0.73*  CREATININE 1.23 1.00  --   TROPONINI  --  0.16* 0.23*    Estimated Creatinine Clearance: 90.5 mL/min (by C-G formula based on SCr of 1 mg/dL).   Medical History: Past Medical History:  Diagnosis Date  . Hypertension   . PAF (paroxysmal atrial fibrillation) (HCC)    a. initially occurring in 06/2015    Medications:  No current facility-administered medications on file prior to encounter.    Current Outpatient Prescriptions on File Prior to Encounter  Medication Sig Dispense Refill  . aspirin 81 MG chewable tablet Chew 81 mg by mouth daily.    . carvedilol (COREG) 12.5 MG tablet Take 12.5 mg by mouth 2 (two) times daily.    . indomethacin (INDOCIN) 25 MG capsule Take 1 capsule (25 mg total) by mouth 3 (three) times daily as needed. (Patient taking differently: Take 25 mg by mouth 3 (three) times daily as needed for mild pain. ) 15 capsule 0  . lisinopril (PRINIVIL,ZESTRIL) 40 MG tablet Take 40 mg by mouth daily.    Marland Kitchen. therapeutic multivitamin-minerals (THERAGRAN-M) tablet Take 1 tablet by mouth daily.      Assessment: 55 y.o. male continues on heparin for Afib/RVR in the setting of cocaine and alcohol use. Hx of Afib not on PTA anti-coag due to CHADS-VaSc <2. Initial HL slightly SUPRAtherapeutic at 0.73 - will decrease rate slightly. CBC stable - Hgb 14.1, plt  180. No bleeding reported.   Goal of Therapy:  Heparin level 0.3-0.7 units/ml Monitor platelets by anticoagulation protocol: Yes   Plan:  -Decrease heparin to 1400 units/hr -6hr HL -Daily HL/CBC -F/U plan for continued parenteral anti-coag   Sherle Poeob Vincent, PharmD Clinical Pharmacist 2:30 PM, 02/26/2016

## 2016-02-26 NOTE — Progress Notes (Signed)
ANTICOAGULATION CONSULT NOTE  Pharmacy Consult for Heparin Indication: atrial fibrillation  Allergies  Allergen Reactions  . Hctz [Hydrochlorothiazide] Shortness Of Breath and Palpitations    Patient Measurements:   Heparin Dosing Weight: 95 kg  Vital Signs: Temp: 97.9 F (36.6 C) (08/31 0135) BP: 135/94 (08/31 0445) Pulse Rate: 95 (08/31 0445)  Labs:  Recent Labs  02/26/16 0130  HGB 16.9  HCT 51.0  PLT 200  CREATININE 1.23    CrCl cannot be calculated (Unknown ideal weight.).   Medical History: Past Medical History:  Diagnosis Date  . Hypertension     Medications:  No current facility-administered medications on file prior to encounter.    Current Outpatient Prescriptions on File Prior to Encounter  Medication Sig Dispense Refill  . aspirin 81 MG chewable tablet Chew 81 mg by mouth daily.    . carvedilol (COREG) 12.5 MG tablet Take 12.5 mg by mouth 2 (two) times daily.    . indomethacin (INDOCIN) 25 MG capsule Take 1 capsule (25 mg total) by mouth 3 (three) times daily as needed. (Patient taking differently: Take 25 mg by mouth 3 (three) times daily as needed for mild pain. ) 15 capsule 0  . lisinopril (PRINIVIL,ZESTRIL) 40 MG tablet Take 40 mg by mouth daily.    Marland Kitchen. therapeutic multivitamin-minerals (THERAGRAN-M) tablet Take 1 tablet by mouth daily.      Assessment: 55 y.o. male with Afib for heparin  Goal of Therapy:  Heparin level 0.3-0.7 units/ml Monitor platelets by anticoagulation protocol: Yes   Plan:  Heparin 4000 units IV bolus, then start heparin 1500 units/hr Check heparin level in 6 hours.   Eddie Candlebbott, Natalina Wieting [place] 02/26/2016,5:02 AM

## 2016-02-27 DIAGNOSIS — R7989 Other specified abnormal findings of blood chemistry: Secondary | ICD-10-CM | POA: Diagnosis not present

## 2016-02-27 DIAGNOSIS — I10 Essential (primary) hypertension: Secondary | ICD-10-CM | POA: Diagnosis not present

## 2016-02-27 DIAGNOSIS — I4891 Unspecified atrial fibrillation: Secondary | ICD-10-CM | POA: Diagnosis not present

## 2016-02-27 DIAGNOSIS — F1910 Other psychoactive substance abuse, uncomplicated: Secondary | ICD-10-CM | POA: Diagnosis not present

## 2016-02-27 LAB — CBC
HCT: 45.1 % (ref 39.0–52.0)
Hemoglobin: 14.5 g/dL (ref 13.0–17.0)
MCH: 28.4 pg (ref 26.0–34.0)
MCHC: 32.2 g/dL (ref 30.0–36.0)
MCV: 88.4 fL (ref 78.0–100.0)
PLATELETS: 162 10*3/uL (ref 150–400)
RBC: 5.1 MIL/uL (ref 4.22–5.81)
RDW: 14.2 % (ref 11.5–15.5)
WBC: 9.3 10*3/uL (ref 4.0–10.5)

## 2016-02-27 LAB — GLUCOSE, CAPILLARY: GLUCOSE-CAPILLARY: 116 mg/dL — AB (ref 65–99)

## 2016-02-27 LAB — HEPARIN LEVEL (UNFRACTIONATED): HEPARIN UNFRACTIONATED: 0.69 [IU]/mL (ref 0.30–0.70)

## 2016-02-27 MED ORDER — DILTIAZEM HCL ER COATED BEADS 240 MG PO CP24
240.0000 mg | ORAL_CAPSULE | Freq: Every day | ORAL | Status: DC
  Administered 2016-02-27: 240 mg via ORAL
  Filled 2016-02-27: qty 1

## 2016-02-27 MED ORDER — DILTIAZEM HCL ER COATED BEADS 240 MG PO CP24: 240.0000 mg | ORAL_CAPSULE | Freq: Every day | ORAL | 0 refills | Status: AC

## 2016-02-27 MED ORDER — LISINOPRIL 40 MG PO TABS
40.0000 mg | ORAL_TABLET | Freq: Every day | ORAL | Status: DC
  Administered 2016-02-27: 40 mg via ORAL
  Filled 2016-02-27: qty 1

## 2016-02-27 MED ORDER — FOLIC ACID 1 MG PO TABS: 1.0000 mg | ORAL_TABLET | Freq: Every day | ORAL | 0 refills | Status: AC

## 2016-02-27 NOTE — Plan of Care (Signed)
Problem: Education: Goal: Knowledge of [facility] Education information/materials will improve Outcome: Progressing Patient aware of plan of care.  RN provided medication education pertaining to all medications administered thus far this shift.  Patient stated understanding.  RN instructed patient to call and wait for staff assistance prior to getting out of bed so staff could set up tethering equipment to make it easier for him to ambulate.  Patient has been complaint with this instruction thus far this shift.

## 2016-02-27 NOTE — Progress Notes (Signed)
Hospital Problem List     Principal Problem:   Atrial fibrillation with RVR (HCC) Active Problems:   Hypertension   Polysubstance abuse   Elevated troponin    Patient Profile:   Primary Cardiologist: From [place]  55 y.o. male with past medical history of polysubstance use (Cocaine, Alcohol, Marijuana, and tobacco), HTN, PAF, and pSVT who [facility]2016 for palpitations. Found to be in SVT, converted with Adenosine but went into Afib with RVR.   Subjective   Denies any chest discomfort or palpitations. Breathing at baseline.   Inpatient Medications    . aspirin  81 mg Oral Daily  . diltiazem  120 mg Oral Q12H  . folic acid  1 mg Oral Daily  . LORazepam  0-4 mg Intravenous Q6H   Followed by  . [START ON 02/28/2016] LORazepam  0-4 mg Intravenous Q12H  . multivitamin with minerals  1 tablet Oral Daily  . pneumococcal 23 valent vaccine  0.5 mL Intramuscular Tomorrow-1000  . thiamine  100 mg Oral Daily   Or  . thiamine  100 mg Intravenous Daily    Vital Signs    Vitals:   02/26/16 1630 02/26/16 2056 02/27/16 0013 02/27/16 0510  BP: 121/80 117/89 135/87 (!) 144/93  Pulse: 75 91 88 95  Resp: 18 18 18 18   Temp: 98.1 F (36.7 C) 97.7 F (36.5 C) 98.2 F (36.8 C) 97.9 F (36.6 C)  TempSrc: Oral Oral Oral Oral  SpO2: 94% 95% 98% 98%  Weight:    199 lb 14.4 oz (90.7 kg)  Height:        Intake/Output Summary (Last 24 hours) at 02/27/16 [id number] Last data filed at 02/26/16 2135  Gross per 24 hour  Intake           921.58 ml  Output              500 ml  Net           421.58 ml   Filed Weights   02/26/16 0551 02/27/16 0510  Weight: 205 lb 3.2 oz (93.1 kg) 199 lb 14.4 oz (90.7 kg)    Physical Exam    General: Well developed, well nourished, male appearing in no acute distress. Head: Normocephalic, atraumatic.  Neck: Supple without bruits, JVD not elevated. Lungs:  Resp regular and unlabored, CTA without wheezing or rales. Heart:  Irregularly irregular, S1, S2, no S3, S4, or murmur; no rub. Abdomen: Soft, non-tender, non-distended with normoactive bowel sounds. No hepatomegaly. No rebound/guarding. No obvious abdominal masses. Extremities: No clubbing, cyanosis, or edema. Distal pedal pulses are 2+ bilaterally. Neuro: Alert and oriented X 3. Moves all extremities spontaneously. Psych: Normal affect.  Labs    CBC  Recent Labs  02/26/16 0602 02/27/16 0536  WBC 12.5* 9.3  NEUTROABS 9.1*  --   HGB 14.1 14.5  HCT 43.2 45.1  MCV 87.8 88.4  PLT 180 162   Basic Metabolic Panel  Recent Labs  02/26/16 0130 02/26/16 0602  NA 136 138  K 4.0 3.8  CL 105 105  CO2 20* 24  GLUCOSE 133* 108*  BUN 17 14  CREATININE 1.23 1.00  CALCIUM 9.7 8.9  MG  --  1.3*   Liver Function Tests  Recent Labs  02/26/16 0602  AST 18  ALT 17  ALKPHOS 69  BILITOT 0.8  PROT 5.8*  ALBUMIN 3.4*   No results for input(s): LIPASE, AMYLASE in the last 72 hours. Cardiac Enzymes  Recent Labs  02/26/16 0602 02/26/16 1224  TROPONINI 0.16* 0.23*   BNP Invalid input(s): POCBNP D-Dimer No results for input(s): DDIMER in the last 72 hours. Hemoglobin A1C No results for input(s): HGBA1C in the last 72 hours. Fasting Lipid Panel No results for input(s): CHOL, HDL, LDLCALC, TRIG, CHOLHDL, LDLDIRECT in the last 72 hours. Thyroid Function Tests  Recent Labs  02/26/16 0602  TSH 0.468    Telemetry    Atrial fibrillation, HR in 70's - 90's.   ECG    No new tracings.    Cardiac Studies and Radiology    Dg Chest Port 1 View  Result Date: 02/26/2016 CLINICAL DATA:  Chest pain and dyspnea, onset tonight EXAM: PORTABLE CHEST 1 VIEW COMPARISON:  None. FINDINGS: A single AP portable view of the chest demonstrates no focal airspace consolidation or alveolar edema. The lungs are grossly clear. There is no large effusion or pneumothorax. Cardiac and mediastinal contours appear unremarkable. IMPRESSION: No active disease.  Electronically Signed   By: Ellery Plunk M.D.   On: 02/26/2016 04:00    Echocardiogram: 06/2015 FINDINGS:  Left Ventricle: Mild concentric left ventricular hypertrophy. Normal left ventricular cavity size.  Left Ventricle Normal global left ventricular systolic function. Mild diastolic dysfunction.  Function:  LVEF: 65 %   Left Atrium: Normal left atrial size. Left atrial volume index 25mLm2.  Right Ventricle: Normal right ventricular size. Normal right ventricular global systolic function.  Right Atrium: Normal right atrial size.  Mitral Valve: Structurally/functionally normal mitral valve. No regurgitation.  Aortic Valve: Structurally normal aortic valve. No regurgitation.  Tricuspid Valve: Normally structured tricuspid valve. No regurgitation.  Pulmonic Valve: Structurally normal pulmonic valve.  Aorta: Normal aortic root diameter.  Pericardium: No pericardial effusion.  Masses / Shunts: No masses, shunts or thrombi seen.  Nuclear Stress Test: 06/2015 FINDINGS: Stress and rest images reveal uniform radioisotope uptake with no consistent photopenia.  The gated SPECT images of Cardiolite study reveal normal left ventricular wall motion.  Low normal left ventricular systolic function with a gated calculated ejection fraction of 46%.  COMBINED INTERPRETATION: 1. No evidence for reversible myocardial ischemia or infarction. 2. Normal left ventricular wall motion, low normal systolic function with gated SPECT ejection fraction of 46%   Assessment & Plan    1. Atrial Fibrillation with RVR - had a history of this in 06/2015 and converted to NSR on IV Cardizem. Consumed an excessive amount of alcohol yesterday and used Cocaine, developing palpitations and presenting in SVT. Following conversion with Adenosine, he went into atrial fibrillation with RVR.  -  TSH 0.468. Mg 1.3 (being replaced). UDS positive for cocaine. Most recent EKG shows atrial fibrillation with RVR, HR 126, LVH  and down-sloping ST in inferolateral leads likely consistent with repolarization abnormality. - has been transitioned from IV Cardizem to Cardizem SR 120mg  BID. Will switch to Cardizem CD 240mg  daily for improved compliance with once daily dosing. Will give now with HR up to 120 when walking around the room. Avoid BB with recent Cocaine use.  - This patients CHA2DS2-VASc Score and unadjusted Ischemic Stroke Rate (% per year) is equal to 0.6 % stroke rate/year from a score of 1 (HTN). With this score, he does not require full-dose anticoagulation. Even if so, this would not be ideal in the setting of his alcohol abuse and noncompliance. Can continue 81mg  ASA.   2. Elevated Troponin - troponin values have been 0.16 and 0.23, likely secondary to demand ischemia in the setting of pSVT,  atrial fibrillation with RVR, and cocaine use. - denies any recent anginal symptoms. 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  - recent NST in 06/2015 showed no evidence of ischemia or prior infarction. EF was estimated at 46% but echo at that time showed preserved EF of 65%. With recent normal NST and lack of anginal symptoms, would not pursue further ischemic evaluation at this time.   3. HTN - BP well-controlled. - On Coreg 12.5mg  BID and Lisinopril 40mg  daily PTA. - Will add back for Lisinopril to restart today. Would avoid resuming BB in setting of recent Cocaine use.   4. Polysubstance Abuse - reports consuming a 12 pack of beer daily, using Marijuana regularly, smoking 1-2 ppd, and using Cocaine 4 times per year. - patient says he is trying to cut back on use of these substances and was strongly encouraged to do so.    Lorri Frederick , PA-C 7:09 AM 02/27/2016 Pager: [phone] Patient seen and examined and history reviewed. Agree with above findings and plan. Patient feels well. HR control is acceptable on oral cardizem. No anticoagulation with  active Etoh abuse. Patient is stable for discharge from a cardiac standpoint. Can follow up with physician in [place]. Recommend abstinence from Etoh and cocaine.  Marlis Oldaker [place], MDFACC 02/27/2016 9:46 AM

## 2016-02-27 NOTE — Discharge Summary (Signed)
Physician Discharge Summary  Allen Porter ZOX:[id number] DOB: [birth year] DOA: 02/26/2016  PCP: No PCP Per Patient  Admit date: 02/26/2016 Discharge date: 02/27/2016  Admitted From: home  Disposition:  Home   Recommendations for Outpatient Follow-up:  1. Follow up with PCP in 1-2 weeks 2. Please obtain BMP/CBC in one week    Discharge Condition: stable.  CODE STATUS: Full code.  Diet recommendation: Heart Healthy   Brief/Interim Summary: Brief Narrative: Allen Porter a 55 y.o.malewith hypertension and history of paroxysmal atrial fibrillation and previous history of SVT and polysubstance abuse presents to the ER with complaints of palpitations. Patient states last night around 10 PM patient had cocaine and alcohol and 2 hours later started developing palpitations. Denies any chest pain or shortness of breath. In the ER patient was initially found to be in SVT and was given adenosine and patient cardioverted but went back into SVT and patient was given a second dose of Adenosinefollowing which patient converted to A. fib with RVR. Patient was started on Cardizem infusion. Patient states he has not taken his medications last 3 days. Patient is usually on lisinopril and Coreg. Patient also was recently diagnosed with gout. At this time patient is being admitted for further management of A. fib with RVR.  1-A fib RVR; in setting of cocaine and alcohol use.  Had episodes of SVT in the ED, received 2 doses of adenosine.  HR controlled on Cardizem Gtt--he was  transition to oral.  Received heparin Gtt.  Cardiology consulted to help with anticoagulation and A fib. No need for anticoagulation, and also patient is high risk due to history alcohol use.  Continue with aspirin at discharge.  Discharge on Cardizem, no BB at discharge due to history of cocaine use.   2-Alcohol use; Monitor on CIWA.   3-HTN; resume Lisinopril .   4-Leukocytosis; trending down. No need for antibiotics.    5-Mild elevation of troponin;  Follow trend. Suspect related to A fib, SVT.   6-Hypomagnesemia; replaced.   Discharge Diagnoses:  Principal Problem:   Atrial fibrillation with RVR (HCC) Active Problems:   Hypertension   Polysubstance abuse   Elevated troponin    Discharge Instructions  Discharge Instructions    Diet - low sodium heart healthy    Complete by:  As directed   Increase activity slowly    Complete by:  As directed       Medication List    STOP taking these medications   carvedilol 12.5 MG tablet Commonly known as:  COREG     TAKE these medications   aspirin 81 MG chewable tablet Chew 81 mg by mouth daily.   diltiazem 240 MG 24 hr capsule Commonly known as:  CARDIZEM CD Take 1 capsule (240 mg total) by mouth daily.   folic acid 1 MG tablet Commonly known as:  FOLVITE Take 1 tablet (1 mg total) by mouth daily.   indomethacin 25 MG capsule Commonly known as:  INDOCIN Take 1 capsule (25 mg total) by mouth 3 (three) times daily as needed. What changed:  reasons to take this   lisinopril 40 MG tablet Commonly known as:  PRINIVIL,ZESTRIL Take 40 mg by mouth daily.   therapeutic multivitamin-minerals tablet Take 1 tablet by mouth daily.       Allergies  Allergen Reactions  . Hctz [Hydrochlorothiazide] Shortness Of Breath and Palpitations    Consultations: Cardiology   Procedures/Studies: Dg Chest Port 1 View  Result Date: 02/26/2016 CLINICAL DATA:  Chest pain and  dyspnea, onset tonight EXAM: PORTABLE CHEST 1 VIEW COMPARISON:  None. FINDINGS: A single AP portable view of the chest demonstrates no focal airspace consolidation or alveolar edema. The lungs are grossly clear. There is no large effusion or pneumothorax. Cardiac and mediastinal contours appear unremarkable. IMPRESSION: No active disease. Electronically Signed   By: Ellery Plunk M.D.   On: 02/26/2016 04:00       Subjective: Feeling well, denies chest pain    Discharge Exam: Vitals:   02/27/16 0510 02/27/16 0727  BP: (!) 144/93 (!) 146/99  Pulse: 95 (!) 113  Resp: 18 18  Temp: 97.9 F (36.6 C) 97.6 F (36.4 C)   Vitals:   02/26/16 2056 02/27/16 0013 02/27/16 0510 02/27/16 0727  BP: 117/89 135/87 (!) 144/93 (!) 146/99  Pulse: 91 88 95 (!) 113  Resp: 18 18 18 18   Temp: 97.7 F (36.5 C) 98.2 F (36.8 C) 97.9 F (36.6 C) 97.6 F (36.4 C)  TempSrc: Oral Oral Oral Oral  SpO2: 95% 98% 98% 97%  Weight:   90.7 kg (199 lb 14.4 oz)   Height:        General: Pt is alert, awake, not in acute distress Cardiovascular: RRR, S1/S2 +, no rubs, no gallops Respiratory: CTA bilaterally, no wheezing, no rhonchi Abdominal: Soft, NT, ND, bowel sounds + Extremities: no edema, no cyanosis    The results of significant diagnostics from this hospitalization (including imaging, microbiology, ancillary and laboratory) are listed below for reference.     Microbiology: Recent Results (from the past 240 hour(s))  MRSA PCR Screening     Status: None   Collection Time: 02/26/16  5:41 AM  Result Value Ref Range Status   MRSA by PCR NEGATIVE NEGATIVE Final    Comment:        The GeneXpert MRSA Assay (FDA approved for NASAL specimens only), is one component of a comprehensive MRSA colonization surveillance program. It is not intended to diagnose MRSA infection nor to guide or monitor treatment for MRSA infections.      Labs: BNP (last 3 results) No results for input(s): BNP in the last 8760 hours. Basic Metabolic Panel:  Recent Labs Lab 02/26/16 0130 02/26/16 0602  NA 136 138  K 4.0 3.8  CL 105 105  CO2 20* 24  GLUCOSE 133* 108*  BUN 17 14  CREATININE 1.23 1.00  CALCIUM 9.7 8.9  MG  --  1.3*   Liver Function Tests:  Recent Labs Lab 02/26/16 0602  AST 18  ALT 17  ALKPHOS 69  BILITOT 0.8  PROT 5.8*  ALBUMIN 3.4*   No results for input(s): LIPASE, AMYLASE in the last 168 hours. No results for input(s): AMMONIA in the  last 168 hours. CBC:  Recent Labs Lab 02/26/16 0130 02/26/16 0602 02/27/16 0536  WBC 17.4* 12.5* 9.3  NEUTROABS  --  9.1*  --   HGB 16.9 14.1 14.5  HCT 51.0 43.2 45.1  MCV 87.5 87.8 88.4  PLT 200 180 162   Cardiac Enzymes:  Recent Labs Lab 02/26/16 0602 02/26/16 1224  TROPONINI 0.16* 0.23*   BNP: Invalid input(s): POCBNP CBG:  Recent Labs Lab 02/26/16 0723 02/27/16 0723  GLUCAP 105* 116*   D-Dimer No results for input(s): DDIMER in the last 72 hours. Hgb A1c No results for input(s): HGBA1C in the last 72 hours. Lipid Profile No results for input(s): CHOL, HDL, LDLCALC, TRIG, CHOLHDL, LDLDIRECT in the last 72 hours. Thyroid function studies  Recent Labs  02/26/16 0602  TSH 0.468   Anemia work up No results for input(s): VITAMINB12, FOLATE, FERRITIN, TIBC, IRON, RETICCTPCT in the last 72 hours. Urinalysis    Component Value Date/Time   COLORURINE YELLOW 02/26/2016 0628   APPEARANCEUR CLEAR 02/26/2016 0628   LABSPEC 1.013 02/26/2016 0628   PHURINE 5.5 02/26/2016 0628   GLUCOSEU NEGATIVE 02/26/2016 0628   HGBUR NEGATIVE 02/26/2016 0628   BILIRUBINUR NEGATIVE 02/26/2016 0628   KETONESUR NEGATIVE 02/26/2016 0628   PROTEINUR NEGATIVE 02/26/2016 0628   NITRITE NEGATIVE 02/26/2016 0628   LEUKOCYTESUR NEGATIVE 02/26/2016 [id number]   Sepsis Labs Invalid input(s): PROCALCITONIN,  WBC,  LACTICIDVEN Microbiology Recent Results (from the past 240 hour(s))  MRSA PCR Screening     Status: None   Collection Time: 02/26/16  5:41 AM  Result Value Ref Range Status   MRSA by PCR NEGATIVE NEGATIVE Final    Comment:        The GeneXpert MRSA Assay (FDA approved for NASAL specimens only), is one component of a comprehensive MRSA colonization surveillance program. It is not intended to diagnose MRSA infection nor to guide or monitor treatment for MRSA infections.      Time coordinating discharge: Over 30 minutes  SIGNED:   Alba Coryegalado, Shandon Burlingame A, MD  [facility] 02/27/2016, 10:52 AM Pager [phone]  If 7PM-7AM, please contact night-coverage [email or web address] Password [email or web address]

## 2017-06-06 IMAGING — CR DG CHEST 1V PORT
1 series · 1 of 1 positions shown · non-contrast
Comparison: None.

CLINICAL DATA: Chest pain and dyspnea, onset tonight

EXAM:
PORTABLE CHEST 1 VIEW

[AP]
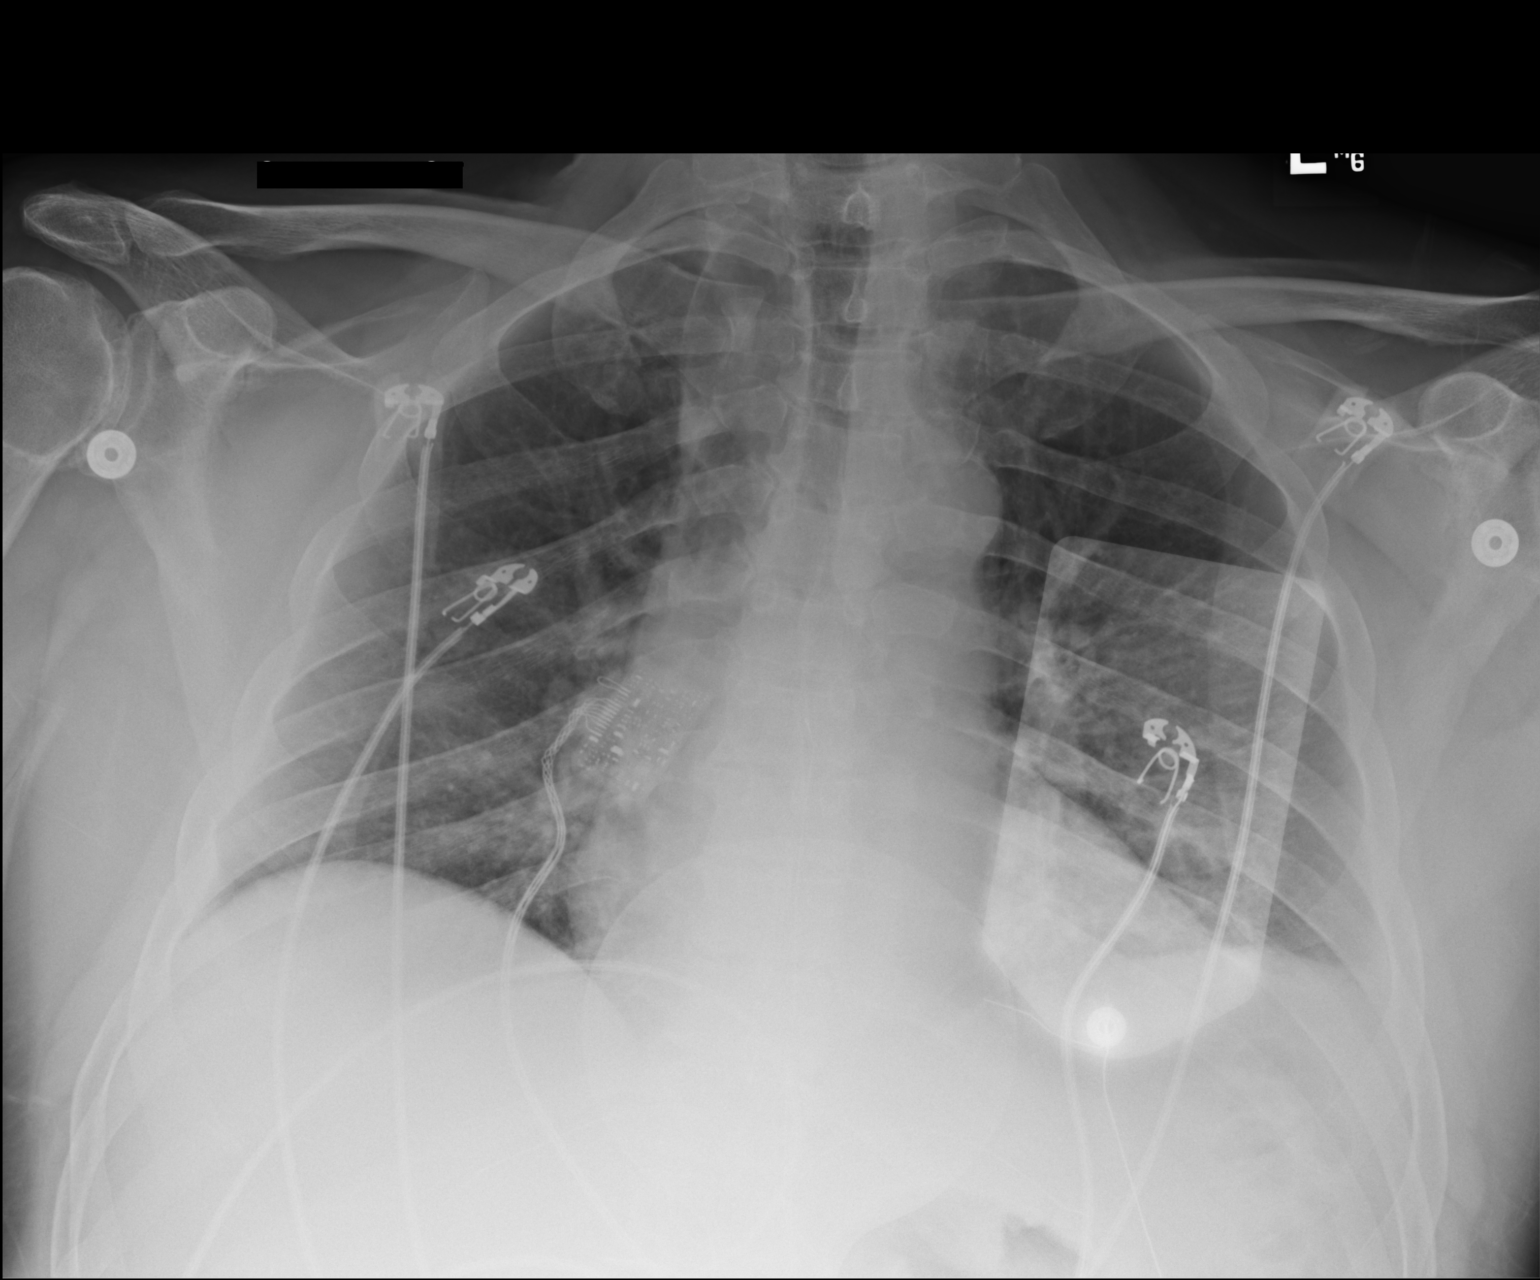

[1 of 1 positions shown; findings below may reference images not displayed]

FINDINGS: A single AP portable view of the chest demonstrates no focal
airspace consolidation or alveolar edema. The lungs are grossly
clear. There is no large effusion or pneumothorax. Cardiac and
mediastinal contours appear unremarkable.
IMPRESSION: No active disease.

## 2019-08-10 LAB — HEMOGLOBIN A1C
Estimated Avg Glucose, External: 111 mg/dL (ref 91–123)
Hemoglobin A1C, External: 5.5 % (ref 4.8–5.6)

## 2020-09-30 NOTE — Anesthesia Pre-Procedure Evaluation (Signed)
Relevant Problems   No relevant active problems       Anesthetic History               Review of Systems / Medical History      Pulmonary    COPD               Neuro/Psych              Cardiovascular            Dysrhythmias            GI/Hepatic/Renal                Endo/Other             Other Findings              Physical Exam    Airway  Mallampati: II           Cardiovascular    Rhythm: irregular           Dental    Dentition: Full upper dentures     Pulmonary  Breath sounds clear to auscultation               Abdominal         Other Findings            Anesthetic Plan    ASA: 3  Anesthesia type: general            Anesthetic plan and risks discussed with: Patient and Sibling

## 2020-09-30 NOTE — Interval H&P Note (Signed)
PREOPERATIVE INSTRUCTIONS  Please Read Carefully    _0  Your procedure is scheduled on: 10/01/2020    _1  The day before your surgery, call the surgeon's office to check on the surgery time and the time you should arrive.     _2  On the day of your surgery, arrive at the time given to you by your surgeon and check in at the first floor registration desk.    _3  DO NOT eat or drink anything after midnight before your surgery. This includes gum, mints or hard candy.    _4  You may brush your teeth the morning of surgery, however DO NOT swallow any water.    _5  No smoking after midnight.  Smoking should be reduced a few days prior to surgery.    _6  If you are having an outpatient procedure, you must have a responsible adult bring you to the hospital.  They must remain in the surgical waiting area for the duration of your procedure and provide you with transportation home.  You may not drive yourself home.  We also recommend that you arrange for a responsible person to stay with you for 24 hours following your procedure.  Failure to comply with these instructions may result in cancellation of the procedure.    _7  A parent or legal guardian must accompany minors to the hospital.  LEGAL GUARDIANS MUST bring custody papers with them the day of surgery.    _8  If the lab gives you a blue blood ID band, do not throw it away.  Bring it with you on the day of surgery.      _9  Please return to preop surgical testing no more than 14 days before surgery date to have your type and screen done prior to surgery.    _10  If you have been diagnosed with Sleep Apnea and are using a CPAP, BiPAP, and/or Bi-Flex machine, please bring it with you the day of surgery.    _11  Endoscopy patients, please follow the instructions provided by the doctors office.    _12  If crutches are ordered, you must get them and be instructed on proper use before the day of surgery.  Please bring them with you the day of surgery.      PREPARING THE SKIN  BEFORE SURGERY Can Reduce the Risk of Infection  _13  You have been given a Chlorhexidine skin preparation packet with instructions to bathe the night before surgery and the morning of surgery prior to arrival.    _14  If allergic to Chlorhexidine, use Dial (antibacterial) soap to bathe your skin the night before surgery and the morning of surgery.    _15  Do not shave your face, underarms, legs or any part of your body at least 48 hours before surgery.  Any clipping needed for surgery will be done at the hospital.      [place]  _16  Wear casual, loose fitting comfortable clothes the day of surgery.    _17  Remove ALL jewelry, including body piercings.  Leave these and all valuables at home.  Leave suitcases and/or overnight bag at home or in the car for a family member to bring when you get a room.    _18  DO NOT wear any makeup, nail polish, deodorant, body lotion, aftershave or contact lenses the day of surgery.  Please bring your glasses and glasses case with you the day of surgery.    _19  Bring any additional paperwork, such as doctors orders, consent form, POA (  power of attorney), and/or advanced directives with you the day of surgery.    '[x]'$  Please notify your surgeon of any changes in your condition such as fever, sore throat or rash as soon as possible before your surgery date.  After office hours, call your surgeon's answering service.    '[x]'$ Bring picture identification and insurance cards with you the day of surgery.      MEDICATIONS:  '[x]'$  Stop taking aspirin and aspirin products/NSAIDs (non-steroidal anti-inflammatory drugs such as Motrin, Aleve, Advil, ibuprofen and BC Powder), vitamins and herbal pills 2 weeks before surgery OR as instructed by your doctor.  However, if you take a daily aspirin, please contact your primary care provider (PCP), for instructions prior to surgery.    '[]'$  Contact your doctor regarding any blood thinner medications you take (such as Coumadin, Plavix, etc.)  for instructions about what to do prior to surgery.    '[]'$ If you have diabetes, hold oral diabetic medications the night before surgery and the morning of surgery.  Hold insulin the morning of surgery unless told otherwise by your doctor.    '[x]'$  If you have asthma or use inhalers please bring them the day of surgery.    '[x]'$ Take the following medicine with a sip of water the day of surgery:10/01/2020    '[x]'$ Take the following home medications:  Medication Documentation Review Audit     Reviewed by Theola Sequin, RN (Registered Nurse) on 09/30/20 at [phone]    Medication Sig Documenting Provider Last Dose Status Taking?   albuterol (PROVENTIL HFA, VENTOLIN HFA, PROAIR HFA) 90 mcg/actuation inhaler Take  by inhalation. Provider, Historical  Active Yes   amLODIPine (NORVASC) 10 mg tablet Take  by mouth daily. Provider, Historical  Active Yes   atorvastatin (LIPITOR) 40 mg tablet Take 40 mg by mouth daily. Provider, Historical  Active Yes   buPROPion SR (WELLBUTRIN SR) 150 mg SR tablet Take 150 mg by mouth daily. Provider, Historical  Active Yes   carvediloL (COREG) 25 mg tablet Take 25 mg by mouth two (2) times daily (with meals). Provider, Historical  Active Yes   flecainide (TAMBOCOR) 100 mg tablet Take 100 mg by mouth two (2) times a day. Provider, Historical  Active Yes   hydrALAZINE (APRESOLINE) 25 mg tablet Take 25 mg by mouth three (3) times daily. Provider, Historical  Active Yes   indomethacin (INDOCIN) 25 mg capsule Take  by mouth as needed. Provider, Historical  Active Yes   Omeprazole delayed release (PRILOSEC D/R) 20 mg tablet Take 20 mg by mouth daily. Provider, Historical  Active Yes   warfarin (Coumadin) 4 mg tablet Take 4 mg by mouth daily. Provider, Historical 09/25/2020 Active As per cardio                 *Visitor Policy-One visitor per patient, must be 72 years of age or older, must wear a mask. Upon check in, the patient must arrive with their post procedure transportation home. The post-procedure  transporting party must remain within the surgical waiting room for the duration of the procedure. Failure may result in the cancellation of the procedure*      We want you to have a positive experience at [facility].  If any of these these instructions are not met, it is possible that your surgery will be canceled.  If you have any questions regarding your surgery, please call [facility] at [phone] or your surgeon's office for further assistance.

## 2020-10-01 ENCOUNTER — Inpatient Hospital Stay: Payer: MEDICAID

## 2020-10-01 LAB — POC PT/INR
INR: 1.1 (ref 0.0–1.1)
Prothrombin time: 13.4 seconds (ref 0.0–14.0)

## 2020-10-01 LAB — POCT PT/INR
INR: 1.1 (ref 0.0–1.1)
Protime: 13.4 seconds (ref 0.0–14.0)

## 2020-10-01 MED ORDER — PROPOFOL 10 MG/ML IV EMUL
10 mg/mL | INTRAVENOUS | Status: DC | PRN
Start: 2020-10-01 — End: 2020-10-01
  Administered 2020-10-01: 17:00:00 via INTRAVENOUS

## 2020-10-01 MED ORDER — PROPOFOL 10 MG/ML IV EMUL
10 mg/mL | INTRAVENOUS | Status: DC | PRN
Start: 2020-10-01 — End: 2020-10-01
  Administered 2020-10-01 (×2): via INTRAVENOUS

## 2020-10-01 MED ORDER — LACTATED RINGERS IV
INTRAVENOUS | Status: DC | PRN
Start: 2020-10-01 — End: 2020-10-01
  Administered 2020-10-01: 16:00:00 via INTRAVENOUS

## 2020-10-01 MED ORDER — LIDOCAINE (PF) 20 MG/ML (2 %) IJ SOLN
20 mg/mL (2 %) | INTRAMUSCULAR | Status: DC | PRN
Start: 2020-10-01 — End: 2020-10-01
  Administered 2020-10-01: 17:00:00 via INTRAVENOUS

## 2020-10-01 MED ORDER — DEXAMETHASONE SODIUM PHOSPHATE 10 MG/ML IJ SOLN
10 mg/mL | Freq: Once | INTRAMUSCULAR | Status: DC
Start: 2020-10-01 — End: 2020-10-01

## 2020-10-01 MED ORDER — LACTATED RINGERS IV
INTRAVENOUS | Status: DC
Start: 2020-10-01 — End: 2020-10-01

## 2020-10-01 MED ORDER — LACTATED RINGERS IV
INTRAVENOUS | Status: DC
Start: 2020-10-01 — End: 2020-10-01
  Administered 2020-10-01: 16:00:00 via INTRAVENOUS

## 2020-10-01 MED FILL — LACTATED RINGERS IV: INTRAVENOUS | Qty: 1000

## 2020-10-01 NOTE — H&P (Signed)
History and Physical    Roberto [name]        1961/01/24  [id number]        [id number]     Pre-Procedure Diagnosis:  (K64.9) HEMORRHOIDS    Chief Complaint:  No chief complaint on file.      HPI: PAtient with history of polyps and due for surveillance. No change in bowel movements. No diarrhea or constipation.  No blood in stool and no melena.     Past Medical History:   Diagnosis Date   ??? Arrhythmia     A.Fib   ??? Chronic obstructive pulmonary disease (HCC)     mild     Past Surgical History:   Procedure Laterality Date   ??? HX APPENDECTOMY     ??? HX GI       History reviewed. No pertinent family history.  Social History     Socioeconomic History   ??? Marital status: SINGLE   Tobacco Use   ??? Smoking status: Former Smoker     Packs/day: 1.00     Years: 15.00     Pack years: 15.00     Quit date: 06/28/2020     Years since quitting: 0.2   ??? Smokeless tobacco: Never Used   Substance and Sexual Activity   ??? Alcohol use: Yes     Comment: two times a week   ??? Drug use: Yes     Types: Marijuana     Comment: occasional bowl       Allergies:    Allergies   Allergen Reactions   ??? Hydrochlorothiazide Anaphylaxis     Medications:   Current Facility-Administered Medications   Medication Dose Route Frequency   ??? lactated Ringers infusion  25 mL/hr IntraVENous CONTINUOUS   ??? lactated Ringers infusion  25 mL/hr IntraVENous CONTINUOUS   ??? dexamethasone (DECADRON) 10 mg/mL injection 10 mg  10 mg IntraVENous ONCE     Vital Signs   Visit Vitals  BP (!) 129/90 (BP 1 Location: Left arm, BP Patient Position: Sitting)   Pulse (!) 59   Temp 97.9 ??F (36.6 ??C)   Resp 18   Ht 6' (1.829 m)   Wt 96.1 kg (211 lb 13.8 oz)   SpO2 96%   BMI 28.73 kg/m??       Review of Systems  A comprehensive review of systems was negative except for that written in the History of Present Illness.    Physical Exam:  General:  Alert, cooperative, no distress, appears stated age.   Eyes:  Conjunctivae/corneas clear. PERRL, EOMs intact. Fundi benign           Mouth/Throat:  Lips, mucosa, and tongue normal. Teeth and gums normal.   Neck: Supple, symmetrical, trachea midline, no adenopathy, thyroid: no enlargement/tenderness/nodules, no carotid bruit and no JVD.       Lungs:   Clear to auscultation bilaterally.   Heart:  Regular rate and rhythm, S1, S2 normal, no murmur, click, rub or gallop.   Abdomen:   Soft, non-tender. Bowel sounds normal. No masses,  No organomegaly.   Extremities: Extremities normal, atraumatic, no cyanosis or edema.       Skin: Skin color, texture, turgor normal. No rashes or lesions             Laboratory Data:  Recent Results (from the past 24 hour(s))   POC PT/INR    Collection Time: 10/01/20 11:36 AM   Result Value Ref Range    Prothrombin time 13.4  0.0 - 14.0 seconds    INR 1.1 0.0 - 1.1             Impression and Plan:  History of colon polyps. Recommend colonoscopy.  See office H&P      Juliane Lack, MD  10/01/2020  12:39 PM

## 2020-10-01 NOTE — Anesthesia Post-Procedure Evaluation (Signed)
Procedure(s):  COLONOSCOPY SURVEILLANCE WITH BX POLYPECTOMY AND COLD SNARED POLYPECTOMY.    general    Anesthesia Post Evaluation      Multimodal analgesia: multimodal analgesia used between 6 hours prior to anesthesia start to PACU discharge  Patient location during evaluation: PACU  Patient participation: complete - patient participated  Level of consciousness: sleepy but conscious  Pain management: satisfactory to patient  Airway patency: patent  Anesthetic complications: no  Cardiovascular status: acceptable  Respiratory status: acceptable  Hydration status: acceptable  Post anesthesia nausea and vomiting:  controlled  Final Post Anesthesia Temperature Assessment:  Normothermia (36.0-37.5 degrees C)      INITIAL Post-op Vital signs:   Vitals Value Taken Time   BP 134/88 10/01/20 1331   Temp     Pulse 57 10/01/20 1309   Resp 12 10/01/20 1309   SpO2 96 % 10/01/20 1331   Vitals shown include unvalidated device data.

## 2023-07-04 NOTE — Progress Notes (Signed)
 I, Harlene LITTIE Hoyle, NP, have reviewed and interpreted the new INR test results for Roberto [name]. The information shared with patient represents my management and instructions as shown.

## 2023-07-08 ENCOUNTER — Ambulatory Visit
Admit: 2023-07-08 | Discharge: 2023-07-08 | Payer: PRIVATE HEALTH INSURANCE | Attending: Urology | Primary: Family Medicine

## 2023-07-08 VITALS — Resp 20 | Ht 72.0 in | Wt 222.0 lb

## 2023-07-08 DIAGNOSIS — R972 Elevated prostate specific antigen [PSA]: Secondary | ICD-10-CM

## 2023-07-08 LAB — AMB POC URINALYSIS DIP STICK AUTO W/O MICRO
Blood, Urine, POC: NEGATIVE
Glucose, Urine, POC: NEGATIVE
Leukocyte Esterase, Urine, POC: NEGATIVE
Nitrite, Urine, POC: NEGATIVE
Specific Gravity, Urine, POC: 1.03 (ref 1.001–1.035)
Urobilinogen, POC: 0.2
pH, Urine, POC: 5.5 (ref 4.6–8.0)

## 2023-07-08 NOTE — Progress Notes (Signed)
Roberto [name]  [birth year]        Chief Complaint   Patient presents with    Elevated PSA     Encounter Diagnosis   Name Primary?    Elevated PSA Yes     ASSESSMENT:    1. Elevated PSA   PSA 1.860 ng/mL from 02/14/19   PSA 3.270 ng/mL from 02/20/22   PSA 5.14 ng/mL from 03/09/23   DRE 07/08/23: Prostate is 40 grams.     2. Right gluteus tumor, benign        PLAN:      UA today is negative  PSA F&T drawn today, will contact pt with results  If repeat PSA FT is elevated, will order MRI Prostate     OK to refill medications as needed except pain medications and antibiotics    No follow-ups on file.      HISTORY OF PRESENT ILLNESS:  Roberto [name] is a 63 y.o. male who is seen in consultation as referred by Dr. Romero Belling, Berline Chough, MD for elevated PSA. Most recent PSA 5.14 ng/mL from 03/09/23, previously 3.270 ng/mL from 02/20/22.      Patient is doing well today.   Patient denies any bothersome urinary symptoms at baseline.   Denies gross hematuria, dysuria, frequency, urgency, incontinence, or straining to void.     Patient reports he does have a right gluteus tumor . He has an upcoming surveillance MRI.   Denies family hx of prostate cancer    LABS AND IMAGING:      PSA Trend  Lab Results   Component Value Date    PSA 6.75 (H) 07/08/2023        Results for orders placed or performed in visit on 07/08/23   PSA (TOTAL, Reflex to FREE)   Result Value Ref Range    PSA 6.75 (H) 0.00 - 4.00 ng/mL   AMB POC URINALYSIS DIP STICK AUTO W/O MICRO   Result Value Ref Range    Color, Urine, POC Yellow     Clarity, Urine, POC Clear     Glucose, Urine, POC Negative     Bilirubin, Urine, POC 1+     Ketones, Urine, POC 1+     Specific Gravity, Urine, POC 1.030 1.001 - 1.035    Blood, Urine, POC Negative Negative    pH, Urine, POC 5.5 4.6 - 8.0    Protein, Urine, POC 1+     Urobilinogen, POC 0.2 mg/dL     Nitrite, Urine, POC Negative     Leukocyte Esterase, Urine, POC Negative        Most recent imaging:  No image results found.        Past  Medical History:   Diagnosis Date    Arrhythmia     A.Fib    Chronic obstructive pulmonary disease (HCC)     mild    Elevated PSA     Tumor     left glulteus tumor benign       Past Surgical History:   Procedure Laterality Date    APPENDECTOMY         Social History     Tobacco Use    Smoking status: Former     Current packs/day: 0.00     Types: Cigarettes     Quit date: 06/28/2020     Years since quitting: 3.0    Smokeless tobacco: Never   Substance Use Topics    Alcohol use: Yes    Drug  use: Yes     Types: Marijuana (Weed)       Allergies   Allergen Reactions    Hydrochlorothiazide Anaphylaxis       No family history on file.    Current Outpatient Medications   Medication Sig Dispense Refill    traZODone (DESYREL) 100 MG tablet Take 0.5-1 tablets by mouth nightly as needed      warfarin (COUMADIN) 2 MG tablet take 2 to 3 tablets by mouth once daily OR AS DIRECTED BY ANTICOAGULATION SERVICES      albuterol sulfate HFA (PROVENTIL;VENTOLIN;PROAIR) 108 (90 Base) MCG/ACT inhaler Inhale into the lungs      amLODIPine (NORVASC) 10 MG tablet Take by mouth daily      atorvastatin (LIPITOR) 40 MG tablet Take 1 tablet by mouth daily      buPROPion (WELLBUTRIN SR) 150 MG extended release tablet Take 1 tablet by mouth daily      carvedilol (COREG) 25 MG tablet Take 1 tablet by mouth 2 times daily (with meals)      flecainide (TAMBOCOR) 100 MG tablet Take 1 tablet by mouth 2 times daily      hydrALAZINE (APRESOLINE) 25 MG tablet Take 1 tablet by mouth 3 times daily      indomethacin (INDOCIN) 25 MG capsule Take by mouth as needed      omeprazole (PRILOSEC OTC) 20 MG tablet Take 1 tablet by mouth daily       No current facility-administered medications for this visit.             PHYSICAL EXAMINATION:   Resp 20   Ht 1.829 m (6')   Wt 100.7 kg (222 lb)   BMI 30.11 kg/m   Constitutional: WDWN, Pleasant and appropriate affect, No acute distress.    CV:  No peripheral swelling noted  Respiratory: No respiratory distress or  difficulties  Abdomen:  No abdominal masses or tenderness. No CVA tenderness. No inguinal hernias noted.   GU Male:  07/08/23  WUJ:[id number] normal to visual inspection, no erythema or irritation, Sphincter with good tone, Rectum with no hemorrhoids, fissures or masses, Prostate smooth, symmetric and anodular. Prostate is 40 grams.   SCROTUM:  No scrotal rash or lesions noticed.  Normal bilateral testes and epididymis.   PENIS: Urethral meatus normal in location and size. No urethral discharge.  Skin: No jaundice.    Neuro/Psych:  Alert and oriented x 3, affect appropriate.   Lymphatic:   No enlarged inguinal lymph nodes.        Review of Systems  The 14 point review of systems was completed and negative except as noted in the HPI   A copy of today's office visit with all pertinent imaging results and labs were sent to the referring physician.        Maryan Char, MD  [facility]   [address]   [place], [place] [address]   Phone: [phone]    Fax: [phone]        Medical documentation is provided with the assistance of Talmadge Coventry, medical scribe for Maryan Char, MD on 07/08/2023

## 2023-07-08 NOTE — Progress Notes (Signed)
Roberto [name] presents today for lab draw per Dr. Fran Lowes order.   Dr. Fran Lowes was present in the clinic as incident to.     PSA obtained via venipuncture without any difficulty.    Patient will be notified with lab results.       Orders Placed This Encounter   Procedures    COLLECTION VENOUS BLOOD,VENIPUNCTURE    PSA (TOTAL, Reflex to FREE)    AMB POC URINALYSIS DIP STICK AUTO W/O MICRO       Carmela Rima

## 2023-07-11 LAB — PSA (FREE)
PSA, Free Pct: 5 %
PSA, Free: 0.349 ng/mL

## 2023-07-11 LAB — PSA (TOTAL, REFLEX TO FREE): PSA: 6.75 ng/mL — ABNORMAL HIGH (ref 0.00–4.00)

## 2023-07-13 NOTE — Telephone Encounter (Signed)
Roberto [name] has an order for MRI Prostate      ALL MALE PATIENTS NEEDING AN MRI OF PELVIS OR PROSTATE, MUST BE SCHEDULED AT ONE OF THE FOLLOWING:    **[facility]/CT ([facility])  **[facility] ([facility])  [facility]  [facility]      To be done at: [facility]    Needed by:   ASAP    Patient has a follow-up appointment:  No  TBD    If MRI, does patient have a pacemaker:   No    Order has been placed in connect care:  Yes    Is this a STAT order:  No      Carmela Rima

## 2023-07-13 NOTE — Telephone Encounter (Addendum)
Called and informed the patient of his results.    ----- Message from Dr. Quincy Sheehan, MD sent at 07/12/2023  9:59 PM EST -----  PSA abnormal-will plan to move forward with MRI prostate

## 2023-07-14 NOTE — Telephone Encounter (Signed)
Patient calling in ref to MRI of the prostate and stated will be going to [facility] in [place] and would like to know if he could get the MRI of Prostate done at the same facility and stated having a MRI pelvis done and have no problems doing the MRI of the prostate and checking if any specifics on the MRI please call to advise.   Patient can be reached [phone]    [facility] at [facility] , phone [phone]

## 2023-07-15 NOTE — Telephone Encounter (Signed)
Faxed MRI Prostate order to [facility] [phone]

## 2023-08-24 NOTE — Telephone Encounter (Signed)
 MRI is scheduled for September 13 2023

## 2023-09-09 NOTE — Telephone Encounter (Signed)
 Patient calling in ref to his MRI of the Prostate and had one scheduled and patient cancelled the appt and would like for office to request an another authorization for the MRI of the Prostate please call patient if or when done  Patient can be reached [phone]
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
## 2023-09-15 NOTE — Telephone Encounter (Signed)
 Roberto [name] has an order for MRI Prostate      ALL MALE PATIENTS NEEDING AN MRI OF PELVIS OR PROSTATE, MUST BE SCHEDULED AT ONE OF THE FOLLOWING:    **MRI[facility]CT ([facility])  **[facility] ([facility])  [facility]  [facility]      To be done at: [facility]    Needed by:   ASAP    Patient has a follow-up appointment:  No      If MRI, does patient have a pacemaker:   No    Order has been placed in connect care:  Yes    Is this a STAT order:  No      Roberto [name]

## 2023-09-16 NOTE — Telephone Encounter (Signed)
 Faxed MRI Prostate order to [facility] [phone]

## 2023-09-20 NOTE — Telephone Encounter (Signed)
 MRI is scheduled for October 13 2023

## 2023-10-17 ENCOUNTER — Encounter

## 2023-10-28 NOTE — Telephone Encounter (Signed)
 Returned call from patient, he had his MRI of prostate on 10/17/23 in [place] , he would like to get the result.    His tel: [phone]

## 2023-11-01 NOTE — Telephone Encounter (Signed)
 Returned call from patient asking for his MRI of prostate results from 10/17/23.    His tel:[phone]

## 2023-11-01 NOTE — Telephone Encounter (Signed)
 Called patient and informed him that I was able to get another provider Dr.Birk to review his MRI. MRI returned showing a PIRADS 4 lesion. Telehealth visit is needed to discuss MRI Fusion Bx. Patient scheduled with Debbrah Faith tomorrow for telehealth visit.

## 2023-11-02 ENCOUNTER — Encounter: Attending: Urology | Primary: Family Medicine

## 2023-11-02 ENCOUNTER — Telehealth: Admit: 2023-11-02 | Payer: PRIVATE HEALTH INSURANCE | Attending: Urology | Primary: Family Medicine

## 2023-11-02 VITALS — Ht 72.0 in | Wt 222.0 lb

## 2023-11-02 DIAGNOSIS — R972 Elevated prostate specific antigen [PSA]: Secondary | ICD-10-CM

## 2023-11-02 NOTE — Progress Notes (Signed)
 Roberto [name]  25-Dec-1960        Chief Complaint   Patient presents with    Results     Telehealth to review MRI results     No diagnosis found.    ASSESSMENT:    Elevated PSA   PSA 1.860 ng/mL from 02/14/19   PSA 3.270 ng/mL from 02/20/22   PSA 5.14 ng/mL from 03/09/23   DRE 07/08/23: Prostate is 40 grams.     History of right gluteus tumor, benign     PMHX: Afib on warfarin       PLAN:      MRI prostate   Will need to hold warfarin   Surgery letter sent to  Carla Charon, plan for the [facility]   Will need cardiac clearance prior     OK to refill medications as needed except pain medications and antibiotics    No follow-ups on file.      HISTORY OF PRESENT ILLNESS:  Roberto [name] is a 63 y.o. male who is seen in consultation as referred by Dr. Rosan Comfort, Vista Grinder, MD for elevated PSA. Most recent PSA 5.14 ng/mL from 03/09/23, previously 3.270 ng/mL from 02/20/22.      Patient is doing well today.   Patient denies any bothersome urinary symptoms at baseline.   Denies gross hematuria, dysuria, frequency, urgency, incontinence, or straining to void.     Patient reports he does have a right gluteus tumor . He has an upcoming surveillance MRI.   Denies family hx of prostate cancer    LABS AND IMAGING:      PSA Trend  Lab Results   Component Value Date    PSA 6.75 (H) 07/08/2023        No results found for this visit on 11/02/23.      Most recent imaging:  MRI PROSTATE W WO CONTRAST  [facility]  [facility]  [address]  [place] [address]  [phone]      Imaging Result     Name:    Chrisotpher, Curcio ([id number])  Sex: Male DOB:  Aug 26, 1960 Ordering Provider: Sisto Duffel  CC Provider:    Diagnosis:  Elevated PSA [R97.20 (ICD-10-CM)]  None Specified  Procedures Performed:  MR PROSTATE W/WO CONTRAST  [id number] Exam Date/Time:  10/13/2023  9:29 PM               EXAM: MRI PELVIS WITH AND WITHOUT CONTRAST - PROSTATE     CLINICAL INDICATION/HISTORY: R97.20: Elevated PSA     COMPARISON: MRI  05/14/2021     TECHNIQUE: MRI pelvis with and without IV contrast performed. Prostate protocol; including T2, T1, diffusion, and dynamic enhanced imaging.     FINDINGS:   Prostate:   Size: 4.0 x 3.2 x 3.9 cm. Volume is 26 mL.     Lesion:   Ill-defined T2 hypointensity with corresponding diffusion restriction and abnormal early enhancement.  Size: 1.1 x 0.7 cm  Location: Posterior right peripheral zone at the base  PI-RADS: 4  Axial T2 Image: 16 (difficult to delineate and T2 images secondary to background chronic inflammatory changes.  Extracapsular extension: Absent     Heterogeneous nodular transition zone consistent with benign prostatic hyperplasia. Linear and wedge-shaped T2 hypointense lesions within the peripheral zone, likely representing sequela of chronic inflammation/prostatitis.     Seminal vesicles: Negative.     Pelvic Metastatic Disease: No suspicious lymphadenopathy or bony lesions.     Significant Additional Findings: Partially  imaged 3.3 x 3.4 cm T1 isointense mass posterior to the right hamstring insertion along the course of the sciatic nerve, not significant change from 2022 exam and better evaluated at that time. Diverticulosis. Incidental prominent inferior mesenteric vessel.     IMPRESSION   1.PI RADS 4 lesion in the right posterior peripheral zone at the base, difficult to delineate from background chronic inflammatory/prostatitis changes.     PI-RADS 4  PI-RADS 1 - Very low (clinically significant cancer is highly unlikely to be present)  PI-RADS 2 - Low (clinically significant cancer is unlikely to be present)  PI-RADS 3 - Intermediate (the presence of clinically significant cancer is equivocal)  PI-RADS 4 - High (clinically significant cancer is likely to be present)  PI-RADS 5 - Very high (clinically significant cancer is highly likely to be present)     Signed By: Alphonsa Jasper, MD on 10/14/2023 10:01 AM           Dictated by: Karlene Overcast on Fri Oct 14, 2023  9:50:39 AM EDT    Signed JJ:OACZYSAYTK, Ellin Gutter, MD INTERFACE, [facility] RES on Fri Oct 14, 2023 10:01:09 AM EDT      ~~This report was copied and pasted from the servicing EHR system.~~        Past Medical History:   Diagnosis Date    Arrhythmia     A.Fib    Chronic obstructive pulmonary disease (HCC)     mild    Elevated PSA     Tumor     left glulteus tumor benign       Past Surgical History:   Procedure Laterality Date    APPENDECTOMY         Social History     Tobacco Use    Smoking status: Former     Current packs/day: 0.00     Types: Cigarettes     Quit date: 06/28/2020     Years since quitting: 3.3    Smokeless tobacco: Never   Vaping Use    Vaping status: Never Used   Substance Use Topics    Alcohol use: Yes    Drug use: Yes     Types: Marijuana (Weed)       Allergies   Allergen Reactions    Hydrochlorothiazide Anaphylaxis       Family History   Family history unknown: Yes       Current Outpatient Medications   Medication Sig Dispense Refill    warfarin (COUMADIN) 2 MG tablet take 2 to 3 tablets by mouth once daily OR AS DIRECTED BY ANTICOAGULATION SERVICES      albuterol sulfate HFA (PROVENTIL;VENTOLIN;PROAIR) 108 (90 Base) MCG/ACT inhaler Inhale into the lungs      amLODIPine (NORVASC) 10 MG tablet Take by mouth daily      atorvastatin (LIPITOR) 40 MG tablet Take 1 tablet by mouth daily      buPROPion (WELLBUTRIN SR) 150 MG extended release tablet Take 1 tablet by mouth daily      carvedilol (COREG) 25 MG tablet Take 1 tablet by mouth 2 times daily (with meals)      flecainide (TAMBOCOR) 100 MG tablet Take 1 tablet by mouth 2 times daily      hydrALAZINE (APRESOLINE) 25 MG tablet Take 1 tablet by mouth 3 times daily      indomethacin (INDOCIN) 25 MG capsule Take by mouth as needed      omeprazole (PRILOSEC OTC) 20 MG tablet Take 1 tablet by mouth  daily       No current facility-administered medications for this visit.           Review of Systems  The 14 point review of systems was completed and negative except  as noted in the HPI   A copy of today's office visit with all pertinent imaging results and labs were sent to the referring physician.        Rockne Chyle, MD  [facility]    [address]   [place], [place] [address]   Phone: [phone]    Fax: [phone]    JURON PETITTE, was evaluated through a synchronous (real-time) audio-video encounter. The patient (or guardian if applicable) is aware that this is a billable service, which includes applicable co-pays. This Virtual Visit was conducted with patient's (and/or legal guardian's) consent. The visit was conducted pursuant to the emergency declaration under the [employer] and the [employer], 1135 waiver authority and the [employer] and [employer] Act. It is within this context (and with the understanding that this method of patient encounter is in the patient's best interest as well as the health and safety of other patients and the public) that telehealth is being provided for this patient encounter rather than a face-to-face visit. This patient encounter is appropriate and reasonable under the circumstances given the patient's particular presentation at this time. The patient has been advised of the potential risks and limitations of this mode of treatment (including, but not limited to, the absence of in-person examination) and has agreed to be treated in a remote fashion. The patient has also been advised to contact this office for worsening conditions or problems, and seek emergency medical treatment and/or call 911 if the patient deems either necessary. Patient identification was verified, and a caregiver was present when appropriate, prior to the initiation of the visit.

## 2023-11-09 NOTE — Telephone Encounter (Signed)
 Spoke with the patient in regards to setting up the MRI Fusion Biopsy that [facility] ordered for him.     Scheduled the patient for 12/08/2023 at [facility] with a procedure time of 0940 and check-in time of 0740.    I told him that I would send him the procedure instructions through mail.

## 2023-11-10 NOTE — Telephone Encounter (Signed)
 Pt is currently taking Augmentin 875 mg was given by patient first  due to a upper respiratory infection to take for 7 days. Pt started on 11/08/2023

## 2023-11-11 NOTE — Telephone Encounter (Signed)
 Attempted to contact patient. LVM for a call back.     Unsure the reason for the call. Patient will be finished with his antibiotics prior to his sx.

## 2023-11-24 NOTE — Telephone Encounter (Signed)
 Patient calling in ref to his upcoming MRI Fusion Biopsy 12/08/23 and stated just received a call regarding his portion $600.00 and would like to check if could be scheduled so he would not have to pay the cost , please call to advise.  Patient can be reached [phone]

## 2023-11-28 NOTE — Telephone Encounter (Signed)
 Pt called in regards to $600 copay for biopsy on 12/08/23 due to it being classified as a ambulatory procedure. Pt states that insurance states that due to having it done in office it is $600 but if it could be done in a hospital it will be a $200 copay. Please advise. Pt states he can't come up with the $600 for the biopsy. Asking for a callback to either get it r/s or come up with another solution. Pt states this is his third time reaching out with no response.       CBN: [phone]

## 2023-11-29 NOTE — Telephone Encounter (Signed)
 Spoke w/ Patient to discuss the copay costs for the facility and different options available.    Patient advised that he will call back tomorrow to determine how he would like to proceed.

## 2023-11-30 LAB — URINALYSIS
Bilirubin, Urine: NEGATIVE
Glucose, Ur: NEGATIVE mg/dL
Leukocyte Esterase, Urine: NEGATIVE
Nitrite, Urine: NEGATIVE
Occult Blood,Urine: NEGATIVE
Specific Gravity, UA: 1.027 (ref 1.005–1.030)
Urobilinogen, Urine: 0.2 mg/dL (ref <2.0)
pH, Urine: 6.5 pH (ref 5.0–8.0)

## 2023-11-30 NOTE — Telephone Encounter (Signed)
 I returned the patient's call to Roberto [name] today about his biopsy on 12/08/2023. I left on his message that I got his voicemail that he can now afford the biopsy copay next week and wants to keep the date. I let him know that I left a note on his chart for Roberto [name] to come back to tomorrow and we would see him next Thursday at his biopsy. I left him Leslie's number for any more questions or issues.

## 2023-12-01 LAB — CULTURE, URINE

## 2023-12-02 MED ORDER — LEVOFLOXACIN 750 MG PO TABS
750 | ORAL_TABLET | Freq: Every day | ORAL | 0 refills | 6.00000 days | Status: AC
Start: 2023-12-02 — End: 2023-12-03

## 2023-12-02 NOTE — Telephone Encounter (Signed)
 Called Levaquin  in to [facility] for Pt's MRI Fusion Biopsy on 12/08/2023.

## 2023-12-08 NOTE — Progress Notes (Signed)
 DATE OF SURGERY: 12/08/2023    PATIENT: [name], Roberto  DOB: 07-26-1960    Procedure:  1) Prostate Biopsy (CPT 55700)  2) Ultrasonic guidance for needle placement (CPT [phone])  3) Ultrasound, transrectal (CPT [phone])  4) Injection, anesthetic agent (CPT [phone])    SURGEON:  Rogelia Clarks, APRN    PREOPERATIVE DIAGNOSIS:  Elevated PSA, MRI lesion in right base peripheral zone    POSTOPERATIVE DIAGNOSIS:  Same    PROCEDURE:  MRI Fusion Prostate Biopsy    ANESTHESIA: MAC    COMPLICATIONS: None.    INDICATIONS: [name] is a 63 year old male with elevated PSA of 6.75 and MRI showing a lesion in the right base peripheral zone of the prostate. He has a history of atrial fibrillation for which he takes Coumadin. Last dose was taken one week ago. Patient took prescribed antibiotic prior to the procedure as instructed.    MRI 10/13/2023  PIRAD lesion in the right base peripheral zone    OPERATIVE PROCEDURE IN DETAIL:    After being properly identified the patient was brought to the operating room, placed in the lateral decubitus position and MAC was initiated. Timeout was called. Antibiotics were confirmed to have been administered. Digital rectal exam was performed, and revealed a mildly enlarged prostate with symmetric firmness at the base. The trans-rectal ultrasound probe was placed in the rectum. A prostate block with 2% Xylocaine was injected in an appropriate fashion. Prostate volume was 26cc as obtained through MRI.    Using the needle guide on the probe, a biopsy needle was used to obtain biopsies of the region of interest and then a standard template biopsy of the prostate with specimens as noted below. Tissue cores were placed in formalin jars and sent for pathologic review.    The probe was removed and the patient was examined and hemostasis was seen to be adequate. At this point he was awoken and transferred to the PACU.    Findings:  Mildly enlarged prostate with symmetric firmness at the base  Prostate  volume 26cc, PSA density 0.26  Calcification noted in the left mid to left base of the prostate at the junction of the peripheral zone and transition zone    Biopsy Pattern with number of cores:    Right Base Peripheral Zone - 5 cores    Additional cores taken from standard template as outlined below              Base      Mid    Apex  Left        1         1            1     Lateral left     1              1            1     Right              1              1            1     Lateral right  1              1            1     Plan:  Appropriate activity levels and timing of resuming sexual activity was reviewed. Patient was warned of potential complications  and with instructions on how to contact our office. This includes but not limited to elevated temperature, excessive bleeding per urethra or rectum as well as others. All questions were answered to his satisfaction.    He will follow up in the office to review results of the biopsy.    Rogelia Clarks  12/08/2023

## 2023-12-14 LAB — PROSTATE BIOPSY

## 2023-12-22 ENCOUNTER — Ambulatory Visit
Admit: 2023-12-22 | Discharge: 2023-12-22 | Payer: PRIVATE HEALTH INSURANCE | Attending: Urology | Primary: Family Medicine

## 2023-12-22 VITALS — Ht 72.0 in | Wt 222.0 lb

## 2023-12-22 DIAGNOSIS — C61 Malignant neoplasm of prostate: Principal | ICD-10-CM

## 2023-12-22 LAB — AMB POC URINALYSIS DIP STICK AUTO W/O MICRO
Bilirubin, Urine, POC: NEGATIVE
Blood, Urine, POC: NEGATIVE
Glucose, Urine, POC: NEGATIVE
Ketones, Urine, POC: NEGATIVE
Leukocyte Esterase, Urine, POC: NEGATIVE
Nitrite, Urine, POC: NEGATIVE
Protein, Urine, POC: NEGATIVE
Specific Gravity, Urine, POC: 1.015 (ref 1.001–1.035)
Urobilinogen, POC: 0.2
pH, Urine, POC: 7 (ref 4.6–8.0)

## 2023-12-22 NOTE — Progress Notes (Signed)
 [name]  April 30, 1961        Chief Complaint   Patient presents with    Elevated PSA    Results     Pt is here for his path results     Encounter Diagnosis   Name Primary?    Prostate cancer [facility]) Yes       ASSESSMENT:    Prostate Cancer GS 3+3 in 6/20 cores positive, S/p MRI fusion biopsy 12/08/23   PSA 1.860 ng/mL from 02/14/19   PSA 3.270 ng/mL from 02/20/22   PSA 5.14 ng/mL from 03/09/23   DRE 07/08/23: Prostate is 40 grams.    MRI Prostate 10/14/23: PI RADS 4 lesion in the right posterior peripheral zone at the base, difficult to delineate from background chronic inflammatory/prostatitis changes.     History of right gluteus tumor, benign     PMHX: Afib on warfarin       PLAN:    Reviewed prostate biopsy pathology from 12/08/23   Discussed prostate cancer treatment. See separate discussion below. Patient would like active surveillance  Patient will consult with billing office due to insurance and pricing issues.  Follow up in 5 months with PSA prior.     OK to refill medications as needed except pain medications and antibiotics    No follow-ups on file.    DISCUSSION: Using NCCN risk stratification criteria, his disease is considered low risk.  We discussed his diagnosis in detail, reviewing the significance of gleason score, PSA, DRE, and percentage of cores positive.  We discussed the various management options for prostate cancer, including active surveillance, open and robotic radical prostatectomy, EBRT, brachytherapy, proton therapy, and cryotherapy.  We discussed the generally indolent course of many prostate cancers, and the generally favorable oncologic control offered by all treatment strategies.  However, I emphasized that all treatments offer only potential for cure and that in many cases multimodal therapy may ultimately be utilized for long term cancer control.  We also discussed the impact of treatment on sexual and urinary function.  I emphasized that each treatment has unique quality of life  impact and recovery profiles, and we reviewed the possible effects of surgery, radiation, and cryotherapy on quality of life outcomes.  All questions were answered.     HISTORY OF PRESENT ILLNESS:  Roberto [name] is a 63 y.o. male who is seen in follow up for newly diagnosed prostate cancer. S/p MRI fusion biopsy 12/08/23, path revealed GS 3+3 in 6/20 cores positive.     Patient is doing well today.   Patient denies any bothersome urinary symptoms at baseline.   Denies gross hematuria, dysuria, frequency, urgency, incontinence, or straining to void.     Patient reports he does have a right gluteus tumor . He has an upcoming surveillance MRI.   Denies family hx of prostate cancer    LABS AND IMAGING:      PSA Trend  Lab Results   Component Value Date    PSA 6.75 (H) 07/08/2023        Results for orders placed or performed in visit on 12/22/23   AMB POC URINALYSIS DIP STICK AUTO W/O MICRO   Result Value Ref Range    Color, Urine, POC Yellow     Clarity, Urine, POC Clear     Glucose, Urine, POC Negative     Bilirubin, Urine, POC Negative     Ketones, Urine, POC Negative     Specific Gravity, Urine, POC 1.015 1.001 - 1.035  Blood, Urine, POC Negative Negative    pH, Urine, POC 7.0 4.6 - 8.0    Protein, Urine, POC Negative     Urobilinogen, POC 0.2 mg/dL     Nitrite, Urine, POC Negative     Leukocyte Esterase, Urine, POC Negative        MRI Fusion pathology 12/08/23  DIAGNOSIS:   A. Right Apex Needle Biopsy                 Benign prostatic tissue.   B. Right Mid Needle Biopsy                 ADENOCARCINOMA, GLEASON SCORE 3 + 3 = 6 involving 15 % of the specimen (1 of 1 core(s) positive).                Grade Group 1. Ends not involved by the tumor.   C. Right Base Needle Biopsy                 Benign prostatic tissue.   D. Right Lateral Apex Needle Biopsy                 ADENOCARCINOMA, GLEASON SCORE 3 + 3 = 6 involving 5 % of the specimen (1 of 1 core(s) positive).                Grade Group 1. Ends not involved by the  tumor.   E. Right Lateral Mid Needle Biopsy                 ADENOCARCINOMA, GLEASON SCORE 3 + 3 = 6 involving 50 % of the specimen (1 of 1 core(s) positive).                Grade Group 1. Ends not involved by the tumor.   F. Right Lateral Base Needle Biopsy                 Benign prostatic tissue.   G. ROI, Right Base Peripheral Zone Needle Biopsy                 ADENOCARCINOMA, GLEASON SCORE 3 + 3 = 6 involving 20 % of the specimen (2 of 5 core(s) positive).                Grade Group 1. Ends not involved by the tumor.   H. Left Apex Needle Biopsy                 ADENOCARCINOMA, GLEASON SCORE 3 + 3 = 6 involving <5 % of the specimen (1 of 1 core(s) positive).                Grade Group 1. Ends not involved by the tumor.   I. Left Mid Needle Biopsy                 Benign prostatic tissue.   J. Left Base Needle Biopsy                 Benign prostatic tissue.   K. Left Lateral Apex Needle Biopsy                 Benign prostatic tissue.   L. Left Lateral Mid Needle Biopsy                 Benign prostatic tissue.   M. Left Lateral Base Needle Biopsy  Benign prostatic tissue.     Most recent imaging:  MRI PROSTATE W WO CONTRAST  [facility]  [facility]  [address]  [place] [address]  [phone]      Imaging Result     Name:    Roberto [name], Roberto [name] ([id number])  Sex: Male DOB:  [birth year] Ordering Provider: Bridgett Brome  CC Provider:    Diagnosis:  Elevated PSA [R97.20 (ICD-10-CM)]  None Specified  Procedures Performed:  MR PROSTATE W/WO CONTRAST  [id number] Exam Date/Time:  10/13/2023  9:29 PM               EXAM: MRI PELVIS WITH AND WITHOUT CONTRAST - PROSTATE     CLINICAL INDICATION/HISTORY: R97.20: Elevated PSA     COMPARISON: MRI 05/14/2021     TECHNIQUE: MRI pelvis with and without IV contrast performed. Prostate protocol; including T2, T1, diffusion, and dynamic enhanced imaging.     FINDINGS:   Prostate:   Size: 4.0 x 3.2 x 3.9 cm. Volume is 26 mL.     Lesion:    Ill-defined T2 hypointensity with corresponding diffusion restriction and abnormal early enhancement.  Size: 1.1 x 0.7 cm  Location: Posterior right peripheral zone at the base  PI-RADS: 4  Axial T2 Image: 16 (difficult to delineate and T2 images secondary to background chronic inflammatory changes.  Extracapsular extension: Absent     Heterogeneous nodular transition zone consistent with benign prostatic hyperplasia. Linear and wedge-shaped T2 hypointense lesions within the peripheral zone, likely representing sequela of chronic inflammation/prostatitis.     Seminal vesicles: Negative.     Pelvic Metastatic Disease: No suspicious lymphadenopathy or bony lesions.     Significant Additional Findings: Partially imaged 3.3 x 3.4 cm T1 isointense mass posterior to the right hamstring insertion along the course of the sciatic nerve, not significant change from 2022 exam and better evaluated at that time. Diverticulosis. Incidental prominent inferior mesenteric vessel.     IMPRESSION   1.PI RADS 4 lesion in the right posterior peripheral zone at the base, difficult to delineate from background chronic inflammatory/prostatitis changes.     PI-RADS 4  PI-RADS 1 - Very low (clinically significant cancer is highly unlikely to be present)  PI-RADS 2 - Low (clinically significant cancer is unlikely to be present)  PI-RADS 3 - Intermediate (the presence of clinically significant cancer is equivocal)  PI-RADS 4 - High (clinically significant cancer is likely to be present)  PI-RADS 5 - Very high (clinically significant cancer is highly likely to be present)     Signed By: Rozelle Ada, MD on 10/14/2023 10:01 AM           Dictated by: ADA ROZELLE PARAS on Fri Oct 14, 2023  9:50:39 AM EDT   Signed Ab:[id number], Rozelle Agent, MD INTERFACE, [facility] on Fri Oct 14, 2023 10:01:09 AM EDT      ~~This report was copied and pasted from the servicing EHR system.~~        Past Medical History:   Diagnosis Date     Arrhythmia     A.Fib    Chronic obstructive pulmonary disease (HCC)     mild    Elevated PSA     Tumor     left glulteus tumor benign       Past Surgical History:   Procedure Laterality Date    APPENDECTOMY      UROLOGICAL SURGERY  12/08/2023    TRANSRECTAL ULTRASOUND AND BIOPSY OF PROSTATE  Donnice JAYSON Forte       Social History     Tobacco Use    Smoking status: Former     Current packs/day: 0.00     Types: Cigarettes     Quit date: 06/28/2020     Years since quitting: 3.5    Smokeless tobacco: Never   Vaping Use    Vaping status: Never Used   Substance Use Topics    Alcohol use: Yes    Drug use: Yes     Types: Marijuana (Weed)       Allergies   Allergen Reactions    Hydrochlorothiazide Anaphylaxis       Family History   Family history unknown: Yes       Current Outpatient Medications   Medication Sig Dispense Refill    warfarin (COUMADIN) 2 MG tablet take 2 to 3 tablets by mouth once daily OR AS DIRECTED BY [facility]TICOAGULATION SERVICES      albuterol sulfate HFA (PROVENTIL;VENTOLIN;PROAIR) 108 (90 Base) MCG/ACT inhaler Inhale into the lungs      amLODIPine (NORVASC) 10 MG tablet Take by mouth daily      atorvastatin (LIPITOR) 40 MG tablet Take 1 tablet by mouth daily      buPROPion (WELLBUTRIN SR) 150 MG extended release tablet Take 1 tablet by mouth daily      carvedilol (COREG) 25 MG tablet Take 1 tablet by mouth 2 times daily (with meals)      flecainide (TAMBOCOR) 100 MG tablet Take 1 tablet by mouth 2 times daily      hydrALAZINE (APRESOLINE) 25 MG tablet Take 1 tablet by mouth 3 times daily      indomethacin (INDOCIN) 25 MG capsule Take by mouth as needed      omeprazole (PRILOSEC OTC) 20 MG tablet Take 1 tablet by mouth daily       No current facility-administered medications for this visit.           Review of Systems  The 14 point review of systems was completed and negative except as noted in the HPI   A copy of today's office visit with all pertinent imaging results and labs were sent to the referring  physician.        Stevens JONETTA Collier, MD  [facility]    [address]   [place], [place] [address]   Phone: [phone]    Fax: [phone]    Medical documentation is provided with the assistance of Waddell Hugger, medical scribe for Stevens JONETTA Collier, MD on 12/22/2023
